# Patient Record
Sex: Male | Born: 1955 | Race: White | Hispanic: No | Marital: Married | Smoking: Former smoker
Health system: Southern US, Community
[De-identification: ages and names within clinical notes are randomized; demographics above are authoritative.]

## PROBLEM LIST (undated history)

## (undated) DIAGNOSIS — I209 Angina pectoris, unspecified: Secondary | ICD-10-CM

## (undated) DIAGNOSIS — I219 Acute myocardial infarction, unspecified: Secondary | ICD-10-CM

## (undated) DIAGNOSIS — I10 Essential (primary) hypertension: Secondary | ICD-10-CM

## (undated) HISTORY — PX: CARDIAC CATHETERIZATION: SHX172

## (undated) HISTORY — PX: CORONARY ARTERY BYPASS GRAFT: SHX141

---

## 2014-08-01 DEATH — deceased

## 2019-03-04 ENCOUNTER — Encounter
Admission: EM | Disposition: A | Discharge status: Home / Self Care | Payer: Self-pay | Source: Home / Self Care | Attending: Internal Medicine

## 2019-03-04 ENCOUNTER — Inpatient Hospital Stay: Payer: BC Managed Care – PPO

## 2019-03-04 ENCOUNTER — Inpatient Hospital Stay
Admission: EM | Admit: 2019-03-04 | Discharge: 2019-03-08 | DRG: 281 | Disposition: A | Discharge status: Home / Self Care | Payer: BC Managed Care – PPO | Attending: Internal Medicine | Admitting: Internal Medicine

## 2019-03-04 ENCOUNTER — Emergency Department: Payer: BC Managed Care – PPO

## 2019-03-04 ENCOUNTER — Other Ambulatory Visit: Payer: Self-pay

## 2019-03-04 DIAGNOSIS — Z79891 Long term (current) use of opiate analgesic: Secondary | ICD-10-CM | POA: Diagnosis not present

## 2019-03-04 DIAGNOSIS — R739 Hyperglycemia, unspecified: Secondary | ICD-10-CM | POA: Diagnosis present

## 2019-03-04 DIAGNOSIS — E785 Hyperlipidemia, unspecified: Secondary | ICD-10-CM | POA: Diagnosis present

## 2019-03-04 DIAGNOSIS — I2119 ST elevation (STEMI) myocardial infarction involving other coronary artery of inferior wall: Secondary | ICD-10-CM | POA: Diagnosis present

## 2019-03-04 DIAGNOSIS — I25110 Atherosclerotic heart disease of native coronary artery with unstable angina pectoris: Secondary | ICD-10-CM | POA: Diagnosis present

## 2019-03-04 DIAGNOSIS — I959 Hypotension, unspecified: Secondary | ICD-10-CM | POA: Diagnosis present

## 2019-03-04 DIAGNOSIS — D6959 Other secondary thrombocytopenia: Secondary | ICD-10-CM | POA: Diagnosis not present

## 2019-03-04 DIAGNOSIS — Z20828 Contact with and (suspected) exposure to other viral communicable diseases: Secondary | ICD-10-CM | POA: Diagnosis present

## 2019-03-04 DIAGNOSIS — I25810 Atherosclerosis of coronary artery bypass graft(s) without angina pectoris: Secondary | ICD-10-CM | POA: Diagnosis present

## 2019-03-04 DIAGNOSIS — I213 ST elevation (STEMI) myocardial infarction of unspecified site: Secondary | ICD-10-CM | POA: Diagnosis present

## 2019-03-04 DIAGNOSIS — I309 Acute pericarditis, unspecified: Secondary | ICD-10-CM | POA: Diagnosis not present

## 2019-03-04 DIAGNOSIS — I10 Essential (primary) hypertension: Secondary | ICD-10-CM | POA: Diagnosis present

## 2019-03-04 DIAGNOSIS — Z955 Presence of coronary angioplasty implant and graft: Secondary | ICD-10-CM | POA: Diagnosis not present

## 2019-03-04 HISTORY — DX: Essential (primary) hypertension: I10

## 2019-03-04 HISTORY — PX: CORONARY/GRAFT ACUTE MI REVASCULARIZATION: CATH118305

## 2019-03-04 HISTORY — PX: LEFT HEART CATH AND CORONARY ANGIOGRAPHY: CATH118249

## 2019-03-04 HISTORY — DX: Angina pectoris, unspecified: I20.9

## 2019-03-04 HISTORY — DX: Acute myocardial infarction, unspecified: I21.9

## 2019-03-04 LAB — COMPREHENSIVE METABOLIC PANEL
ALT: 19 U/L (ref 0–44)
AST: 18 U/L (ref 15–41)
Albumin: 4 g/dL (ref 3.5–5.0)
Alkaline Phosphatase: 100 U/L (ref 38–126)
Anion gap: 9 (ref 5–15)
BUN: 15 mg/dL (ref 8–23)
CO2: 26 mmol/L (ref 22–32)
Calcium: 9.8 mg/dL (ref 8.9–10.3)
Chloride: 99 mmol/L (ref 98–111)
Creatinine, Ser: 0.96 mg/dL (ref 0.61–1.24)
GFR calc Af Amer: >60 mL/min (ref >60)
GFR calc non Af Amer: >60 mL/min (ref >60)
Glucose, Bld: 138 mg/dL — ABNORMAL HIGH (ref 70–99)
Potassium: 3.9 mmol/L (ref 3.5–5.1)
Sodium: 134 mmol/L — ABNORMAL LOW (ref 135–145)
Total Bilirubin: 0.5 mg/dL (ref 0.3–1.2)
Total Protein: 7.6 g/dL (ref 6.5–8.1)

## 2019-03-04 LAB — CBC WITH DIFFERENTIAL/PLATELET
Abs Immature Granulocytes: 0.09 10*3/uL — ABNORMAL HIGH (ref 0.00–0.07)
Basophils Absolute: 0.1 10*3/uL (ref 0.0–0.1)
Basophils Relative: 1 %
Eosinophils Absolute: 0.1 10*3/uL (ref 0.0–0.5)
Eosinophils Relative: 1 %
HCT: 50.5 % (ref 39.0–52.0)
Hemoglobin: 17 g/dL (ref 13.0–17.0)
Immature Granulocytes: 1 %
Lymphocytes Relative: 18 %
Lymphs Abs: 2.9 10*3/uL (ref 0.7–4.0)
MCH: 29 pg (ref 26.0–34.0)
MCHC: 33.7 g/dL (ref 30.0–36.0)
MCV: 86.2 fL (ref 80.0–100.0)
Monocytes Absolute: 0.9 10*3/uL (ref 0.1–1.0)
Monocytes Relative: 6 %
Neutro Abs: 11.8 10*3/uL — ABNORMAL HIGH (ref 1.7–7.7)
Neutrophils Relative %: 73 %
Platelets: 239 10*3/uL (ref 150–400)
RBC: 5.86 MIL/uL — ABNORMAL HIGH (ref 4.22–5.81)
RDW: 13.9 % (ref 11.5–15.5)
WBC: 15.9 10*3/uL — ABNORMAL HIGH (ref 4.0–10.5)
nRBC: 0 % (ref 0.0–0.2)

## 2019-03-04 LAB — LIPID PANEL
Cholesterol: 158 mg/dL (ref 0–200)
HDL: 30 mg/dL — ABNORMAL LOW (ref >40)
LDL Cholesterol: 108 mg/dL — ABNORMAL HIGH (ref 0–99)
Total CHOL/HDL Ratio: 5.3 RATIO
Triglycerides: 102 mg/dL (ref <150)
VLDL: 20 mg/dL (ref 0–40)

## 2019-03-04 LAB — PROTIME-INR
INR: 1 (ref 0.8–1.2)
Prothrombin Time: 13.5 seconds (ref 11.4–15.2)

## 2019-03-04 LAB — TROPONIN I (HIGH SENSITIVITY): Troponin I (High Sensitivity): 42 ng/L — ABNORMAL HIGH (ref <18)

## 2019-03-04 LAB — APTT: aPTT: 37 seconds — ABNORMAL HIGH (ref 24–36)

## 2019-03-04 LAB — POC SARS CORONAVIRUS 2 AG: SARS Coronavirus 2 Ag: NEGATIVE

## 2019-03-04 SURGERY — CORONARY/GRAFT ACUTE MI REVASCULARIZATION
Anesthesia: Moderate Sedation

## 2019-03-04 MED ORDER — CARVEDILOL 3.125 MG PO TABS
3.1250 mg | ORAL_TABLET | Freq: Two times a day (BID) | ORAL | Status: DC
  Administered 2019-03-05 – 2019-03-07 (×6): 3.125 mg via ORAL
  Filled 2019-03-04 (×7): qty 1

## 2019-03-04 MED ORDER — TICAGRELOR 90 MG PO TABS
90.0000 mg | ORAL_TABLET | Freq: Two times a day (BID) | ORAL | Status: DC
  Administered 2019-03-05 – 2019-03-07 (×6): 90 mg via ORAL
  Filled 2019-03-04 (×6): qty 1

## 2019-03-04 MED ORDER — HYDRALAZINE HCL 20 MG/ML IJ SOLN: 10.0000 mg | INTRAMUSCULAR | Status: AC | PRN

## 2019-03-04 MED ORDER — NITROGLYCERIN IN D5W 200-5 MCG/ML-% IV SOLN
INTRAVENOUS | Status: AC
  Filled 2019-03-04: qty 250

## 2019-03-04 MED ORDER — MIDAZOLAM HCL 2 MG/2ML IJ SOLN
INTRAMUSCULAR | Status: AC
  Filled 2019-03-04: qty 2

## 2019-03-04 MED ORDER — SODIUM CHLORIDE 0.9 % WEIGHT BASED INFUSION
1.0000 mL/kg/h | INTRAVENOUS | Status: AC
  Administered 2019-03-05: 1 mL/kg/h via INTRAVENOUS

## 2019-03-04 MED ORDER — HEPARIN (PORCINE) IN NACL 2000-0.9 UNIT/L-% IV SOLN
INTRAVENOUS | Status: DC | PRN
  Administered 2019-03-04: 1000 mL

## 2019-03-04 MED ORDER — HEPARIN SODIUM (PORCINE) 5000 UNIT/ML IJ SOLN: 4000.0000 [IU] | Freq: Once | INTRAMUSCULAR | Status: DC

## 2019-03-04 MED ORDER — TICAGRELOR 90 MG PO TABS: 90.0000 mg | ORAL_TABLET | Freq: Two times a day (BID) | ORAL | Status: DC

## 2019-03-04 MED ORDER — ATORVASTATIN CALCIUM 20 MG PO TABS
40.0000 mg | ORAL_TABLET | Freq: Every day | ORAL | Status: DC
  Administered 2019-03-05 – 2019-03-07 (×3): 40 mg via ORAL
  Filled 2019-03-04 (×3): qty 2

## 2019-03-04 MED ORDER — TIROFIBAN (AGGRASTAT) BOLUS VIA INFUSION
INTRAVENOUS | Status: DC | PRN
  Administered 2019-03-04: 2212.5 ug via INTRAVENOUS

## 2019-03-04 MED ORDER — IOHEXOL 300 MG/ML  SOLN
INTRAMUSCULAR | Status: DC | PRN
  Administered 2019-03-04: 70 mL via INTRA_ARTERIAL

## 2019-03-04 MED ORDER — ONDANSETRON HCL 4 MG/2ML IJ SOLN
4.0000 mg | Freq: Four times a day (QID) | INTRAMUSCULAR | Status: DC | PRN
  Administered 2019-03-05 – 2019-03-06 (×4): 4 mg via INTRAVENOUS
  Filled 2019-03-04 (×4): qty 2

## 2019-03-04 MED ORDER — ASPIRIN 81 MG PO CHEW: 324.0000 mg | CHEWABLE_TABLET | Freq: Once | ORAL | Status: DC

## 2019-03-04 MED ORDER — SODIUM CHLORIDE 0.9 % IV SOLN
Freq: Once | INTRAVENOUS | Status: AC
  Administered 2019-03-04: via INTRAVENOUS

## 2019-03-04 MED ORDER — FENTANYL CITRATE (PF) 100 MCG/2ML IJ SOLN
INTRAMUSCULAR | Status: DC | PRN
  Administered 2019-03-04 (×2): 25 ug via INTRAVENOUS

## 2019-03-04 MED ORDER — NITROGLYCERIN IN D5W 200-5 MCG/ML-% IV SOLN
INTRAVENOUS | Status: AC | PRN
  Administered 2019-03-04: 20 ug/min via INTRAVENOUS

## 2019-03-04 MED ORDER — TIROFIBAN HCL IN NACL 5-0.9 MG/100ML-% IV SOLN
INTRAVENOUS | Status: AC | PRN
  Administered 2019-03-04: 0.15 ug/kg/min via INTRAVENOUS
  Administered 2019-03-05: 0.15 ug/kg/min

## 2019-03-04 MED ORDER — ACETAMINOPHEN 325 MG PO TABS: 650.0000 mg | ORAL_TABLET | ORAL | Status: DC | PRN

## 2019-03-04 MED ORDER — SODIUM CHLORIDE 0.9 % IV SOLN
INTRAVENOUS | Status: DC
  Administered 2019-03-04: 10 mL/h via INTRAVENOUS

## 2019-03-04 MED ORDER — TIROFIBAN HCL IV 12.5 MG/250 ML
INTRAVENOUS | Status: AC
  Filled 2019-03-04: qty 250

## 2019-03-04 MED ORDER — SODIUM CHLORIDE 0.9% FLUSH
3.0000 mL | Freq: Two times a day (BID) | INTRAVENOUS | Status: DC
  Administered 2019-03-05: 3 mL via INTRAVENOUS

## 2019-03-04 MED ORDER — CHLORHEXIDINE GLUCONATE CLOTH 2 % EX PADS
6.0000 | MEDICATED_PAD | Freq: Every day | CUTANEOUS | Status: DC
  Administered 2019-03-06 – 2019-03-08 (×3): 6 via TOPICAL

## 2019-03-04 MED ORDER — TICAGRELOR 90 MG PO TABS
ORAL_TABLET | ORAL | Status: AC
  Filled 2019-03-04: qty 2

## 2019-03-04 MED ORDER — SODIUM CHLORIDE 0.9 % IV SOLN: 250.0000 mL | INTRAVENOUS | Status: DC | PRN

## 2019-03-04 MED ORDER — ASPIRIN 81 MG PO CHEW
81.0000 mg | CHEWABLE_TABLET | Freq: Every day | ORAL | Status: DC
  Administered 2019-03-05 – 2019-03-07 (×2): 81 mg via ORAL
  Filled 2019-03-04 (×2): qty 1

## 2019-03-04 MED ORDER — ASPIRIN 81 MG PO CHEW
CHEWABLE_TABLET | ORAL | Status: AC
  Filled 2019-03-04: qty 4

## 2019-03-04 MED ORDER — MIDAZOLAM HCL 2 MG/2ML IJ SOLN
INTRAMUSCULAR | Status: DC | PRN
  Administered 2019-03-04 (×2): 1 mg via INTRAVENOUS

## 2019-03-04 MED ORDER — SODIUM CHLORIDE 0.9% FLUSH
3.0000 mL | Freq: Two times a day (BID) | INTRAVENOUS | Status: DC
  Administered 2019-03-04 – 2019-03-05 (×3): 3 mL via INTRAVENOUS

## 2019-03-04 MED ORDER — HEPARIN (PORCINE) IN NACL 1000-0.9 UT/500ML-% IV SOLN
INTRAVENOUS | Status: AC
  Filled 2019-03-04: qty 1000

## 2019-03-04 MED ORDER — LABETALOL HCL 5 MG/ML IV SOLN: 10.0000 mg | INTRAVENOUS | Status: AC | PRN

## 2019-03-04 MED ORDER — LOSARTAN POTASSIUM 50 MG PO TABS: 50.0000 mg | ORAL_TABLET | Freq: Every day | ORAL | Status: DC

## 2019-03-04 MED ORDER — ASPIRIN 81 MG PO CHEW
CHEWABLE_TABLET | ORAL | Status: DC | PRN
  Administered 2019-03-04: 324 mg via ORAL

## 2019-03-04 MED ORDER — TICAGRELOR 90 MG PO TABS
ORAL_TABLET | ORAL | Status: DC | PRN
  Administered 2019-03-04: 180 mg via ORAL

## 2019-03-04 MED ORDER — FENTANYL CITRATE (PF) 100 MCG/2ML IJ SOLN
INTRAMUSCULAR | Status: AC
  Filled 2019-03-04: qty 2

## 2019-03-04 MED ORDER — SODIUM CHLORIDE 0.9% FLUSH
3.0000 mL | INTRAVENOUS | Status: DC | PRN
  Administered 2019-03-05: 3 mL via INTRAVENOUS
  Filled 2019-03-04: qty 3

## 2019-03-04 SURGICAL SUPPLY — 12 items
CATH INFINITI 5FR JL4 (CATHETERS) ×3 IMPLANT
CATH INFINITI JR4 5F (CATHETERS) ×3 IMPLANT
DEVICE CLOSURE MYNXGRIP 6/7F (Vascular Products) ×3 IMPLANT
DEVICE INFLAT 30 PLUS (MISCELLANEOUS) IMPLANT
GLIDESHEATH SLEND SS 6F .021 (SHEATH) IMPLANT
KIT MANI 3VAL PERCEP (MISCELLANEOUS) ×3 IMPLANT
NEEDLE PERC 18GX7CM (NEEDLE) ×3 IMPLANT
PACK CARDIAC CATH (CUSTOM PROCEDURE TRAY) ×3 IMPLANT
PROTECTION STATION PRESSURIZED (MISCELLANEOUS) ×3
SHEATH AVANTI 6FR X 11CM (SHEATH) ×3 IMPLANT
STATION PROTECTION PRESSURIZED (MISCELLANEOUS) ×1 IMPLANT
WIRE GUIDERIGHT .035X150 (WIRE) ×3 IMPLANT

## 2019-03-04 NOTE — ED Triage Notes (Signed)
Pt arrived via [facility] from [place] stop with a STEMI. Pt is a truck driver c/o chest pain. 18G left AC. Hx of CABG. Pt is on asa, took nitro before calling ems. Pt a&o x4.

## 2019-03-04 NOTE — ED Provider Notes (Signed)
[facility] Emergency Department Provider Note  ____________________________________________   First MD Initiated Contact with Patient 03/04/19 2116     (approximate)  I have reviewed the triage vital signs and the nursing notes.  History  Chief Complaint Code STEMI    HPI Jose Savage is a 63 y.o. male with hx of CABG who presents for chest pain. He developed central chest pressure this afternoon around 4 PM. Describes it as if someone is standing on his chest + bad indigestion. Central in location, moderate/severe in severity. Does not radiate. No alleviating/aggravating factors.  Associated with nausea. He took baby ASA x 4 and nitro. Called EMS, found to have inferior STEMI on EKG. Activated as CODE STEMI by EMS.   Reports CABG about years ago in [place]. Works as a Administrator.    Past Medical Hx History reviewed. No pertinent past medical history.  Problem List There are no active problems to display for this patient.   Past Surgical Hx CABG  Medications Prior to Admission medications   Not on File    Allergies Patient has no allergy information on record.  Family Hx History reviewed. No pertinent family history.  Social Hx Social History   Tobacco Use  . Smoking status: Not on file  Substance Use Topics  . Alcohol use: Not on file  . Drug use: Not on file     Review of Systems  Constitutional: Negative for fever, chills. Eyes: Negative for visual changes. ENT: Negative for sore throat. Cardiovascular: + for chest pain. Respiratory: Negative for shortness of breath. Gastrointestinal: + nausea Genitourinary: Negative for dysuria. Musculoskeletal: Negative for leg swelling. Skin: Negative for rash. Neurological: Negative for for headaches.   Physical Exam  Vital Signs: ED Triage Vitals  Enc Vitals Group     BP 03/04/19 2112 140/87     Pulse Rate 03/04/19 2112 68     Resp 03/04/19 2112 16     Temp 03/04/19  2112 98 F (36.7 C)     Temp Source 03/04/19 2112 Oral     SpO2 03/04/19 2112 98 %     Weight 03/04/19 2113 195 lb (88.5 kg)     Height 03/04/19 2113 5\' 11"  (1.803 m)     Head Circumference --      Peak Flow --      Pain Score 03/04/19 2113 5     Pain Loc --      Pain Edu? --      Excl. in [place]? --     Constitutional: Alert and oriented.  Head: Normocephalic. Atraumatic. Eyes: Conjunctivae clear. Sclera anicteric. Nose: No congestion. No rhinorrhea. Mouth/Throat: Wearing mask.  Neck: No stridor.   Cardiovascular: Normal rate, regular rhythm. Extremities well perfused. Respiratory: Normal respiratory effort.   Musculoskeletal: No lower extremity edema. No deformities. Neurologic:  Normal speech and language. No gross focal neurologic deficits are appreciated.  Skin: Skin is warm, dry and intact. No rash noted. Psychiatric: Mood and affect are appropriate for situation.  EKG  Personally reviewed.   Rate: 59 Rhythm: sinus Axis: normal Intervals: WNL ST elevation in II, III, aVF with reciprocal changes in I and aVL STEMI    Radiology  N/A   Procedures  Procedure(s) performed (including critical care):  .Critical Care Performed by: Lilia Pro., MD Authorized by: Lilia Pro., MD   Critical care provider statement:    Critical care time (minutes):  15   Critical care was time spent personally by  me on the following activities:  Discussions with consultants, evaluation of patient's response to treatment, examination of patient, ordering and performing treatments and interventions, ordering and review of laboratory studies, ordering and review of radiographic studies, pulse oximetry, re-evaluation of patient's condition, obtaining history from patient or surrogate and review of old charts     Initial Impression / Assessment and Plan / ED Course  63 y.o. male with history of CABG who presents to the ED for chest pressure, as above. STEMI criteria on EMS EKG.  Took full dose ASA and nitro prior to arrival.  EKG on arrival to ED again demonstrates STEMI, as noted above. HDS. CODE STEMI activated in the field by EMS. Discussed with cardiology. Patient to cath lab.     Final Clinical Impression(s) / ED Diagnosis  Final diagnoses:  ST elevation myocardial infarction (STEMI), unspecified artery (HCC)       Note:  This document was prepared using Dragon voice recognition software and may include unintentional dictation errors.   Miguel Aschoff., MD 03/05/19 [phone]

## 2019-03-04 NOTE — Consult Note (Signed)
[facility] Cardiology  CARDIOLOGY CONSULT NOTE  Patient ID: Jose Savage MRN: [id number] DOB/AGE: [id number] y.o.  Admit date: 03/04/2019 Referring Physician [facility] Primary Physician  Primary Cardiologist  Reason for Consultation inferior STEMI  HPI: 63 year old gentleman referred for evaluation of inferior ST elevation myocardial infarction.  Patient has a history of coronary artery disease, status post prior coronary stents.  He underwent CABG in 2014 in [place].  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  ECG revealed ST elevations in inferior leads consistent with inferior ST elevation myocardial infarction.  Review of systems complete and found to be negative unless listed above      No medications prior to admission.   Social History   Socioeconomic History  . Marital status: Unknown    Spouse name: Not on file  . Number of children: Not on file  . Years of education: Not on file  . Highest education level: Not on file  Occupational History  . Not on file  Social Needs  . Financial resource strain: Not on file  . Food insecurity    Worry: Not on file    Inability: Not on file  . Transportation needs    Medical: Not on file    Non-medical: Not on file  Tobacco Use  . Smoking status: Not on file  Substance and Sexual Activity  . Alcohol use: Not on file  . Drug use: Not on file  . Sexual activity: Not on file  Lifestyle  . Physical activity    Days per week: Not on file    Minutes per session: Not on file  . Stress: Not on file  Relationships  . Social Musician on phone: Not on file    Gets together: Not on file    Attends religious service: Not on file    Active member of club or organization: Not on file    Attends meetings of clubs or organizations: Not on file    Relationship status: Not on file  . Intimate  partner violence    Fear of current or ex partner: Not on file    Emotionally abused: Not on file    Physically abused: Not on file    Forced sexual activity: Not on file  Other Topics Concern  . Not on file  Social History Narrative  . Not on file    History reviewed. No pertinent family history.    Review of systems complete and found to be negative unless listed above      PHYSICAL EXAM  General: Well developed, well nourished, in no acute distress HEENT:  Normocephalic and atramatic Neck:  No JVD.  Lungs: Clear bilaterally to auscultation and percussion. Heart: HRRR . Normal S1 and S2 without gallops or murmurs.  Abdomen: Bowel sounds are positive, abdomen soft and non-tender  Msk:  Back normal, normal gait. Normal strength and tone for age. Extremities: No clubbing, cyanosis or edema.   Neuro: Alert and oriented X 3. Psych:  Good affect, responds appropriately  Labs:   Lab Results  Component Value Date   WBC 15.9 (H) 03/04/2019   HGB 17.0 03/04/2019   HCT 50.5 03/04/2019   MCV 86.2 03/04/2019   PLT 239 03/04/2019    Recent Labs  Lab 03/04/19 2112  NA 134*  K 3.9  CL 99  CO2 26  BUN 15  CREATININE 0.96  CALCIUM 9.8  PROT 7.6  BILITOT 0.5  ALKPHOS 100  ALT 19  AST 18  GLUCOSE 138*   No results found for: CKTOTAL, CKMB, CKMBINDEX, TROPONINI  Lab Results  Component Value Date   CHOL 158 03/04/2019   Lab Results  Component Value Date   HDL 30 (L) 03/04/2019   Lab Results  Component Value Date   LDLCALC 108 (H) 03/04/2019   Lab Results  Component Value Date   TRIG 102 03/04/2019   Lab Results  Component Value Date   CHOLHDL 5.3 03/04/2019   No results found for: LDLDIRECT    Radiology: No results found.  EKG: Sinus rhythm with ST elevation in leads II, III and aVF  ASSESSMENT AND PLAN:   1.  Inferior ST elevation myocardial infarction 2.  Essential hypertension 3.  Hyperlipidemia  Recommendations  1.  Emergent cardiac  catheterization and probable primary PCI  Signed: Isaias Cowman MD,PhD, [facility] 03/04/2019, 10:56 PM

## 2019-03-04 NOTE — ED Notes (Signed)
Cardiologist at bedside.  

## 2019-03-04 NOTE — Progress Notes (Signed)
   03/04/19 2128  Clinical Encounter Type  Visited With Patient not available;Health care provider  Visit Type Initial;Code  Referral From Nurse   Chaplain received a code STEMI page for the patient. Upon arrival, the medical team was assessing the patient and providing care. Chaplain maintained pastoral presence outside of the patient's room. No family present at this time. Patient transported to the Cath Lab for procedure. Please call or page if necessary.

## 2019-03-05 ENCOUNTER — Encounter: Payer: Self-pay | Admitting: Emergency Medicine

## 2019-03-05 ENCOUNTER — Other Ambulatory Visit: Payer: Self-pay

## 2019-03-05 ENCOUNTER — Inpatient Hospital Stay
Admit: 2019-03-05 | Discharge: 2019-03-05 | Disposition: A | Discharge status: Home / Self Care | Payer: BC Managed Care – PPO | Attending: Cardiology | Admitting: Cardiology

## 2019-03-05 DIAGNOSIS — E785 Hyperlipidemia, unspecified: Secondary | ICD-10-CM

## 2019-03-05 DIAGNOSIS — I959 Hypotension, unspecified: Secondary | ICD-10-CM

## 2019-03-05 DIAGNOSIS — I2119 ST elevation (STEMI) myocardial infarction involving other coronary artery of inferior wall: Principal | ICD-10-CM

## 2019-03-05 LAB — TROPONIN I (HIGH SENSITIVITY)
Troponin I (High Sensitivity): 403 ng/L (ref <18)
Troponin I (High Sensitivity): 23917 ng/L (ref <18)
Troponin I (High Sensitivity): 22535 ng/L (ref <18)

## 2019-03-05 LAB — MRSA PCR SCREENING: MRSA by PCR: NEGATIVE

## 2019-03-05 LAB — SARS CORONAVIRUS 2 BY RT PCR (HOSPITAL ORDER, PERFORMED IN ~~LOC~~ HOSPITAL LAB): SARS Coronavirus 2: NEGATIVE

## 2019-03-05 LAB — GLUCOSE, CAPILLARY: Glucose-Capillary: 123 mg/dL — ABNORMAL HIGH (ref 70–99)

## 2019-03-05 MED ORDER — SODIUM CHLORIDE 0.9 % IV SOLN: 250.0000 mL | INTRAVENOUS | Status: DC | PRN

## 2019-03-05 MED ORDER — SODIUM CHLORIDE 0.9 % WEIGHT BASED INFUSION: 1.0000 mL/kg/h | INTRAVENOUS | Status: DC

## 2019-03-05 MED ORDER — MORPHINE BOLUS VIA INFUSION: 1.0000 mg | INTRAVENOUS | Status: DC | PRN

## 2019-03-05 MED ORDER — MORPHINE SULFATE (PF) 2 MG/ML IV SOLN
2.0000 mg | INTRAVENOUS | Status: DC | PRN
  Administered 2019-03-05 – 2019-03-06 (×4): 2 mg via INTRAVENOUS
  Filled 2019-03-05 (×4): qty 1

## 2019-03-05 MED ORDER — MORPHINE SULFATE (PF) 2 MG/ML IV SOLN
INTRAVENOUS | Status: AC
  Administered 2019-03-05: 2 mg via INTRAVENOUS
  Filled 2019-03-05: qty 1

## 2019-03-05 MED ORDER — SODIUM CHLORIDE 0.9% FLUSH: 3.0000 mL | INTRAVENOUS | Status: DC | PRN

## 2019-03-05 MED ORDER — SODIUM CHLORIDE 0.9% FLUSH
3.0000 mL | INTRAVENOUS | Status: DC | PRN
  Administered 2019-03-07: 3 mL via INTRAVENOUS
  Filled 2019-03-05: qty 3

## 2019-03-05 MED ORDER — MORPHINE BOLUS VIA INFUSION: 2.0000 mg | INTRAVENOUS | Status: DC | PRN

## 2019-03-05 MED ORDER — SODIUM CHLORIDE 0.9% FLUSH
3.0000 mL | Freq: Two times a day (BID) | INTRAVENOUS | Status: DC
  Administered 2019-03-05 – 2019-03-07 (×2): 3 mL via INTRAVENOUS

## 2019-03-05 MED ORDER — SODIUM CHLORIDE 0.9 % IV SOLN
Freq: Once | INTRAVENOUS | Status: AC
  Administered 2019-03-05: 02:00:00 via INTRAVENOUS

## 2019-03-05 MED ORDER — ASPIRIN 81 MG PO CHEW
81.0000 mg | CHEWABLE_TABLET | ORAL | Status: AC
  Administered 2019-03-06: 81 mg via ORAL
  Filled 2019-03-05: qty 1

## 2019-03-05 MED ORDER — ASPIRIN 81 MG PO CHEW: 81.0000 mg | CHEWABLE_TABLET | ORAL | Status: AC

## 2019-03-05 MED ORDER — SODIUM CHLORIDE 0.9 % WEIGHT BASED INFUSION: 3.0000 mL/kg/h | INTRAVENOUS | Status: AC

## 2019-03-05 MED ORDER — TIROFIBAN HCL IV 12.5 MG/250 ML
0.1500 ug/kg/min | INTRAVENOUS | Status: DC
  Administered 2019-03-05 (×2): 0.15 ug/kg/min via INTRAVENOUS
  Filled 2019-03-05 (×4): qty 250

## 2019-03-05 MED ORDER — MORPHINE SULFATE (PF) 2 MG/ML IV SOLN: 1.0000 mg | INTRAVENOUS | Status: DC | PRN

## 2019-03-05 MED ORDER — SODIUM CHLORIDE 0.9% FLUSH
3.0000 mL | Freq: Two times a day (BID) | INTRAVENOUS | Status: DC
  Administered 2019-03-05 – 2019-03-07 (×4): 3 mL via INTRAVENOUS

## 2019-03-05 MED ORDER — SODIUM CHLORIDE 0.9 % WEIGHT BASED INFUSION
1.0000 mL/kg/h | INTRAVENOUS | Status: DC
  Administered 2019-03-05 (×2): 1 mL/kg/h via INTRAVENOUS

## 2019-03-05 MED ORDER — HEART ATTACK BOUNCING BOOK: Freq: Once | Status: DC

## 2019-03-05 NOTE — Plan of Care (Signed)
Pt with episode of vomiting x2, Zofran given x2times. Chest Pressure/CP at 2/10, morphine 2mg  given x2 time with reliefs each time. Nitro paused D/T low BP/LHR MD aware. 2lit NS bolus given as ordered. BP improved. Pt dangled at bedside and stood up and ambulated in room without any problem. Pt voiding without any problem. Right groin sheet at level zero. Bilat dorsal/pedal pulses 2+ and strong. VSS now. Pt with occu PVC  when moving around. Pt continued on Aggrastat gtt started at 2230 per cath lab report at 0.70mcq will run for 18hrs per MD and will be stopped at 16:30pm. NS at 88.5/hr as ordered.

## 2019-03-05 NOTE — H&P (Signed)
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 male with medical history significant of CAD s/p stent (many years ago) and CABG x 4 in 2014, hypertension and hyperlipidemia who presented with symptoms of mid-sternal pressure chest pain that radiated to his right shoulder. He at first thought it was indigestion and tried tums without relieve. Later took aspirin x4  and nitro. Pain lasted 4 hours until he drove through [place] on his truck and called EMS.  EKG showed ST elevations in inferior leads consistent with inferior ST elevation MI and CODE STEMI was activated.  Patient was subsequently taken for emergent cardiac catheterization.  Found to have 'severe two-vessel coronary artery disease with chronically occluded mid to distal RCA, chronic occluded mid left circumflex.  Atretic LIMA to LAD with patent native LAD.  Occluded SVG to distal RCA with diffuse heavy thrombus burden.' Hospitalist asked by cardiologist Paraschos to admit for continuous medical management.  Patient was placed on nitro and Aggrastat bolus and gtt by cardiology. His chest pain is now a 1/10. BP was noted to be 96/60 with MAP of 71 with continuous IV fluid infusing . Bradycardic down to 50s on room air.   CBC showed leukocytosis of 15.9 but no anemia.  Sodium 134, potassium 3.9, glucose 138, normal creatinine of 0.96.  Troponin of 42.  Negative chest x-ray.  Former tobacco use but quit in 2013. Denies alcohol and illicit drug use.  Review of Systems:  Constitutional: No Weight Change, No Fever ENT/Mouth: No sore throat, No Rhinorrhea Eyes: No Eye Pain, No Vision Changes Cardiovascular: + Chest Pain, no SOB Respiratory: No Cough, no Dyspnea   Gastrointestinal: No Nausea, No Vomiting, No Diarrhea, No Constipation, No Pain Genitourinary: no Urinary Incontinence, No Urgency, No Flank Pain Musculoskeletal: No Arthralgias, No Myalgias Skin: No Skin Lesions, No Pruritus, Neuro: no Weakness, No Numbness,  No Loss of Consciousness, No Syncope Psych: No Anxiety/Panic, No Depression, no decrease appetite Heme/Lymph: No Bruising, No Bleeding   Prior to Admission medications   Not on File    Physical Exam: Vitals:   03/04/19 2112 03/04/19 2113 03/04/19 2345 03/05/19 0000  BP: 140/87  93/62 (!) 83/62  Pulse: 68     Resp: 16  15 18   Temp: 98 F (36.7 C)   97.8 F (36.6 C)  TempSrc: Oral   Oral  SpO2: 98%   97%  Weight:  88.5 kg  82.5 kg  Height:  5\' 11"  (1.803 m)  5\' 11"  (1.803 m)    Constitutional: NAD, calm, comfortable, nontoxic appearing male laying flat in bed Vitals:   03/04/19 2112 03/04/19 2113 03/04/19 2345 03/05/19 0000  BP: 140/87  93/62 (!) 83/62  Pulse: 68     Resp: 16  15 18   Temp: 98 F (36.7 C)   97.8 F (36.6 C)  TempSrc: Oral   Oral  SpO2: 98%   97%  Weight:  88.5 kg  82.5 kg  Height:  5\' 11"  (1.803 m)  5\' 11"  (1.803 m)   Eyes: PERRL, lids and conjunctivae normal ENMT: Mucous membranes are moist. Posterior pharynx clear of any exudate or lesions. Neck: normal, supple, no masses Respiratory: clear to auscultation bilaterally, no wheezing, no  crackles. Normal respiratory effort. No accessory muscle use.  Cardiovascular: Bradycardia down to 50s with normal sinus rhythm on telemetry, no murmurs / rubs / gallops. No extremity edema. 2+ pedal pulses.  Abdomen: no tenderness, no masses palpated.  Bowel sounds positive.  Clean and dry bandage to right groin s/p cardiac catheterization. Musculoskeletal: no clubbing / cyanosis. No joint deformity upper and lower extremities. Good ROM, no contractures. Normal muscle tone.  Skin: no rashes, lesions, ulcers. No induration Neurologic: CN 2-12 grossly intact.  Sensation intact. Strength 5/5 in all 4.  Psychiatric: Normal judgment and insight. Alert and oriented x 3. Normal mood.    Labs on Admission: I have personally reviewed following labs and imaging studies  CBC: Recent Labs  Lab 03/04/19 2112  WBC 15.9*  NEUTROABS 11.8*  HGB 17.0  HCT 50.5  MCV 86.2  PLT 239   Basic Metabolic Panel: Recent Labs  Lab 03/04/19 2112  NA 134*  K 3.9  CL 99  CO2 26  GLUCOSE 138*  BUN 15  CREATININE 0.96  CALCIUM 9.8   GFR: Estimated Creatinine Clearance: 82.8 mL/min (by C-G formula based on SCr of 0.96 mg/dL). Liver Function Tests: Recent Labs  Lab 03/04/19 2112  AST 18  ALT 19  ALKPHOS 100  BILITOT 0.5  PROT 7.6  ALBUMIN 4.0   No results for input(s): LIPASE, AMYLASE in the last 168 hours. No results for input(s): AMMONIA in the last 168 hours. Coagulation Profile: Recent Labs  Lab 03/04/19 2112  INR 1.0   Cardiac Enzymes: No results for input(s): CKTOTAL, CKMB, CKMBINDEX, TROPONINI in the last 168 hours. BNP (last 3 results) No results for input(s): PROBNP in the last 8760 hours. HbA1C: No results for input(s): HGBA1C in the last 72 hours. CBG: No results for input(s): GLUCAP in the last 168 hours. Lipid Profile: Recent Labs    03/04/19 2112  CHOL 158  HDL 30*  LDLCALC 108*  TRIG 102  CHOLHDL 5.3   Thyroid Function Tests: No results for input(s): TSH, T4TOTAL, FREET4, T3FREE, THYROIDAB in the last 72 hours. Anemia Panel: No results for input(s): VITAMINB12, FOLATE, FERRITIN, TIBC, IRON, RETICCTPCT in the last 72 hours. Urine analysis: No results found for: COLORURINE, APPEARANCEUR, LABSPEC, PHURINE, GLUCOSEU, HGBUR, BILIRUBINUR, KETONESUR, PROTEINUR, UROBILINOGEN, NITRITE, LEUKOCYTESUR  Radiological Exams on Admission: Dg Chest Port 1 View  Result Date: 03/04/2019 CLINICAL DATA:  63 year old male with STEMI. EXAM: PORTABLE CHEST 1 VIEW COMPARISON:  None. FINDINGS: There is no focal consolidation, pleural  effusion, or pneumothorax. The cardiac silhouette is within normal limits. Median sternotomy wires and CABG vascular clips. No acute osseous pathology. IMPRESSION: No active disease. Electronically Signed   By: Elgie Collard M.D.   On: 03/04/2019 23:31    EKG: Independently reviewed.   Assessment/Plan  Inferior wall STEMI -S/p cardiac cath 12/2 with findings of severe two-vessel CAD, treated LIMA to LAD, occluded SVG to distal RCA with diffuse heavy thrombus burden -Continuous telemetry monitoring -Cardiology recommends initial conservative management -Continue dual antiplatelet therapy with aspirin and Brilinta -Continue Aggrastat bolus and infusion -Possible repeat relook cardiac catheterization on 12/3-will keep NPO  Hypotension - continue IV fluid infusion - Maintain MAP >65  - Hold Losartan for now  Hyperlipidemia -Continue Lipitor  DVT prophylaxis: Aggrastat infusion Code Status:Full Family Communication: Plan discussed with patient at bedside  disposition Plan: Home with at least 2 midnight stays  Consults called: Cardiology Admission status: inpatient      Kathrine Rieves T Jazelle Achey DO [facility]  If 7PM-7AM, please contact night-coverage [email or web address] Password [email or web address]  03/05/2019, 12:21 AM

## 2019-03-05 NOTE — Progress Notes (Signed)
PROGRESS NOTE    Jose Savage  EHU:[id number] DOB: 1955-11-13 DOA: 03/04/2019 PCP: Patient, No Pcp Per       Assessment & Plan:   Active Problems:   STEMI involving oth coronary artery of inferior wall (HCC)   STEMI (ST elevation myocardial infarction) (HCC)  #. STEMI: S/p cardiac cath 12/2 with findings of severe two-vessel CAD, treated LIMA to LAD, occluded SVG to distal RCA with diffuse heavy thrombus burden. No stents were place. Continue w/ medical management but pt may go back to cath lab today as per cardio. Cardio recs apprec. Continue on tele   #. Hypotension: continue on IVFs. Keep MAP >65  #. HLD: continue on statin  #. Leukocytosis: likely reactive. Will continue to monitor   #. Hyperglycemia: no hx of DM. Will continue to monitor     DVT prophylaxis: aggrastat infusion  Code Status: full     Consultants:  Cardio    Procedures: cardiac cath, refer to cath report for more information    Antimicrobials: n/a   Subjective: Pt c/o chest pain   Objective: Vitals:   03/05/19 0630 03/05/19 0700 03/05/19 0800 03/05/19 0811  BP: 109/75 120/76 112/68 112/68  Pulse: 76 63 64 63  Resp: 19 15 16    Temp:      TempSrc:      SpO2: 96% 95% 95%   Weight:      Height:        Intake/Output Summary (Last 24 hours) at 03/05/2019 0838 Last data filed at 03/05/2019 0500 Gross per 24 hour  Intake 2594.57 ml  Output 2200 ml  Net 394.57 ml   Filed Weights   03/04/19 2113 03/05/19 0000  Weight: 88.5 kg 82.5 kg    Examination:  General exam: Appears calm and comfortable  Respiratory system: Clear to auscultation. Respiratory effort normal. Cardiovascular system: S1 & S2 heard. No rubs or gallops  Gastrointestinal system: Abdomen is nondistended, soft and nontender. Normal bowel sounds heard. Central nervous system: Alert and oriented. Moves all 4 extremities Psychiatry: Judgement and insight appear normal. Mood & affect appropriate.     Data Reviewed: I  have personally reviewed following labs and imaging studies  CBC: Recent Labs  Lab 03/04/19 2112  WBC 15.9*  NEUTROABS 11.8*  HGB 17.0  HCT 50.5  MCV 86.2  PLT 239   Basic Metabolic Panel: Recent Labs  Lab 03/04/19 2112  NA 134*  K 3.9  CL 99  CO2 26  GLUCOSE 138*  BUN 15  CREATININE 0.96  CALCIUM 9.8   GFR: Estimated Creatinine Clearance: 82.8 mL/min (by C-G formula based on SCr of 0.96 mg/dL). Liver Function Tests: Recent Labs  Lab 03/04/19 2112  AST 18  ALT 19  ALKPHOS 100  BILITOT 0.5  PROT 7.6  ALBUMIN 4.0   No results for input(s): LIPASE, AMYLASE in the last 168 hours. No results for input(s): AMMONIA in the last 168 hours. Coagulation Profile: Recent Labs  Lab 03/04/19 2112  INR 1.0   Cardiac Enzymes: No results for input(s): CKTOTAL, CKMB, CKMBINDEX, TROPONINI in the last 168 hours. BNP (last 3 results) No results for input(s): PROBNP in the last 8760 hours. HbA1C: No results for input(s): HGBA1C in the last 72 hours. CBG: No results for input(s): GLUCAP in the last 168 hours. Lipid Profile: Recent Labs    03/04/19 2112  CHOL 158  HDL 30*  LDLCALC 108*  TRIG 102  CHOLHDL 5.3   Thyroid Function Tests: No results for input(s): TSH,  T4TOTAL, FREET4, T3FREE, THYROIDAB in the last 72 hours. Anemia Panel: No results for input(s): VITAMINB12, FOLATE, FERRITIN, TIBC, IRON, RETICCTPCT in the last 72 hours. Sepsis Labs: No results for input(s): PROCALCITON, LATICACIDVEN in the last 168 hours.  Recent Results (from the past 240 hour(s))  MRSA PCR Screening     Status: None   Collection Time: 03/04/19 10:53 PM   Specimen: Nasal Mucosa; Nasopharyngeal  Result Value Ref Range Status   MRSA by PCR NEGATIVE NEGATIVE Final    Comment:        The GeneXpert MRSA Assay (FDA approved for NASAL specimens only), is one component of a comprehensive MRSA colonization surveillance program. It is not intended to diagnose MRSA infection nor to guide  or monitor treatment for MRSA infections. Performed at [facility], [address] [place], [place] [address]   SARS Coronavirus 2 by RT PCR (hospital order, performed in [facility] hospital lab) Nasopharyngeal Nasopharyngeal Swab     Status: None   Collection Time: 03/04/19 10:54 PM   Specimen: Nasopharyngeal Swab  Result Value Ref Range Status   SARS Coronavirus 2 NEGATIVE NEGATIVE Final    Comment: (NOTE) SARS-CoV-2 target nucleic acids are NOT DETECTED. The SARS-CoV-2 RNA is generally detectable in upper and lower respiratory specimens during the acute phase of infection. The lowest concentration of SARS-CoV-2 viral copies this assay can detect is 250 copies / mL. A negative result does not preclude SARS-CoV-2 infection and should not be used as the sole basis for treatment or other patient management decisions.  A negative result may occur with improper specimen collection / handling, submission of specimen other than nasopharyngeal swab, presence of viral mutation(s) within the areas targeted by this assay, and inadequate number of viral copies (<250 copies / mL). A negative result must be combined with clinical observations, patient history, and epidemiological information. Fact Sheet for Patients:   StrictlyIdeas.no Fact Sheet for Healthcare Providers: BankingDealers.co.za This test is not yet approved or cleared  by the [place] FDA and has been authorized for detection and/or diagnosis of SARS-CoV-2 by FDA under an Emergency Use Authorization (EUA).  This EUA will remain in effect (meaning this test can be used) for the duration of the COVID-19 declaration under Section 564(b)(1) of the Act, 21 U.S.C. section 360bbb-3(b)(1), unless the authorization is terminated or revoked sooner. Performed at [facility], [address] [place], [place] [address]          Radiology Studies: Dg Chest  [place] 1 View  Result Date: 03/04/2019 CLINICAL DATA:  63 year old male with STEMI. EXAM: PORTABLE CHEST 1 VIEW COMPARISON:  None. FINDINGS: There is no focal consolidation, pleural effusion, or pneumothorax. The cardiac silhouette is within normal limits. Median sternotomy wires and CABG vascular clips. No acute osseous pathology. IMPRESSION: No active disease. Electronically Signed   By: Anner Crete M.D.   On: 03/04/2019 23:31        Scheduled Meds: . aspirin  81 mg Oral Daily  . atorvastatin  40 mg Oral q1800  . carvedilol  3.125 mg Oral BID WC  . Chlorhexidine Gluconate Cloth  6 each Topical Q0600  . heparin  4,000 Units Intravenous Once  . sodium chloride flush  3 mL Intravenous Q12H  . sodium chloride flush  3 mL Intravenous Q12H  . sodium chloride flush  3 mL Intravenous Q12H  . ticagrelor  90 mg Oral BID   Continuous Infusions: . sodium chloride 10 mL/hr (03/04/19 2124)  . sodium  chloride    . sodium chloride 1 mL/kg/hr (03/05/19 0500)     LOS: 1 day    Time spent: 35 mins    Charise KillianJamiese M Radek Carnero, MD [facility] Pager [phone]  If 7PM-7AM, please contact night-coverage [email or web address] Password [email or web address] 03/05/2019, 8:38 AM

## 2019-03-05 NOTE — Progress Notes (Signed)
*  PRELIMINARY RESULTS* Echocardiogram 2D Echocardiogram has been performed.  Jose Savage Jose Savage 04/24/2018, 9:49 AM 

## 2019-03-06 ENCOUNTER — Encounter
Admission: EM | Disposition: A | Discharge status: Home / Self Care | Payer: Self-pay | Source: Home / Self Care | Attending: Internal Medicine

## 2019-03-06 DIAGNOSIS — I213 ST elevation (STEMI) myocardial infarction of unspecified site: Secondary | ICD-10-CM

## 2019-03-06 LAB — CBC WITH DIFFERENTIAL/PLATELET
Abs Immature Granulocytes: 0.04 10*3/uL (ref 0.00–0.07)
Abs Immature Granulocytes: 0.04 10*3/uL (ref 0.00–0.07)
Basophils Absolute: 0.1 10*3/uL (ref 0.0–0.1)
Basophils Absolute: 0.1 10*3/uL (ref 0.0–0.1)
Basophils Relative: 1 %
Basophils Relative: 0 %
Eosinophils Absolute: 0 10*3/uL (ref 0.0–0.5)
Eosinophils Absolute: 0 10*3/uL (ref 0.0–0.5)
Eosinophils Relative: 0 %
Eosinophils Relative: 0 %
HCT: 44.5 % (ref 39.0–52.0)
HCT: 45.5 % (ref 39.0–52.0)
Hemoglobin: 14.7 g/dL (ref 13.0–17.0)
Hemoglobin: 15.6 g/dL (ref 13.0–17.0)
Immature Granulocytes: 0 %
Immature Granulocytes: 0 %
Lymphocytes Relative: 24 %
Lymphocytes Relative: 16 %
Lymphs Abs: 2.9 10*3/uL (ref 0.7–4.0)
Lymphs Abs: 2.1 10*3/uL (ref 0.7–4.0)
MCH: 28.9 pg (ref 26.0–34.0)
MCH: 28.9 pg (ref 26.0–34.0)
MCHC: 33 g/dL (ref 30.0–36.0)
MCHC: 34.3 g/dL (ref 30.0–36.0)
MCV: 87.4 fL (ref 80.0–100.0)
MCV: 84.4 fL (ref 80.0–100.0)
Monocytes Absolute: 1.1 10*3/uL — ABNORMAL HIGH (ref 0.1–1.0)
Monocytes Absolute: 1.1 10*3/uL — ABNORMAL HIGH (ref 0.1–1.0)
Monocytes Relative: 9 %
Monocytes Relative: 8 %
Neutro Abs: 8.1 10*3/uL — ABNORMAL HIGH (ref 1.7–7.7)
Neutro Abs: 10.4 10*3/uL — ABNORMAL HIGH (ref 1.7–7.7)
Neutrophils Relative %: 66 %
Neutrophils Relative %: 76 %
Platelets: 26 10*3/uL — CL (ref 150–400)
Platelets: 27 10*3/uL — CL (ref 150–400)
RBC: 5.09 MIL/uL (ref 4.22–5.81)
RBC: 5.39 MIL/uL (ref 4.22–5.81)
RDW: 14.1 % (ref 11.5–15.5)
RDW: 14.3 % (ref 11.5–15.5)
WBC: 12.2 10*3/uL — ABNORMAL HIGH (ref 4.0–10.5)
WBC: 13.7 10*3/uL — ABNORMAL HIGH (ref 4.0–10.5)
nRBC: 0 % (ref 0.0–0.2)
nRBC: 0 % (ref 0.0–0.2)

## 2019-03-06 LAB — COMPREHENSIVE METABOLIC PANEL
ALT: 24 U/L (ref 0–44)
AST: 93 U/L — ABNORMAL HIGH (ref 15–41)
Albumin: 3.3 g/dL — ABNORMAL LOW (ref 3.5–5.0)
Alkaline Phosphatase: 70 U/L (ref 38–126)
Anion gap: 9 (ref 5–15)
BUN: 8 mg/dL (ref 8–23)
CO2: 23 mmol/L (ref 22–32)
Calcium: 8.4 mg/dL — ABNORMAL LOW (ref 8.9–10.3)
Chloride: 107 mmol/L (ref 98–111)
Creatinine, Ser: 0.84 mg/dL (ref 0.61–1.24)
GFR calc Af Amer: >60 mL/min (ref >60)
GFR calc non Af Amer: >60 mL/min (ref >60)
Glucose, Bld: 97 mg/dL (ref 70–99)
Potassium: 3.6 mmol/L (ref 3.5–5.1)
Sodium: 139 mmol/L (ref 135–145)
Total Bilirubin: 0.7 mg/dL (ref 0.3–1.2)
Total Protein: 6.3 g/dL — ABNORMAL LOW (ref 6.5–8.1)

## 2019-03-06 LAB — TYPE AND SCREEN
ABO/RH(D): O NEG
Antibody Screen: NEGATIVE

## 2019-03-06 LAB — IMMATURE PLATELET FRACTION: Immature Platelet Fraction: 12.1 % — ABNORMAL HIGH (ref 1.2–8.6)

## 2019-03-06 LAB — FIBRINOGEN: Fibrinogen: 500 mg/dL — ABNORMAL HIGH (ref 210–475)

## 2019-03-06 LAB — TROPONIN I (HIGH SENSITIVITY)
Troponin I (High Sensitivity): 8385 ng/L (ref <18)
Troponin I (High Sensitivity): 8783 ng/L (ref <18)

## 2019-03-06 LAB — VITAMIN B12: Vitamin B-12: 194 pg/mL (ref 180–914)

## 2019-03-06 LAB — LACTATE DEHYDROGENASE: LDH: 341 U/L — ABNORMAL HIGH (ref 98–192)

## 2019-03-06 SURGERY — LEFT HEART CATH
Anesthesia: Moderate Sedation

## 2019-03-06 SURGERY — LEFT HEART CATH
Anesthesia: Moderate Sedation | Laterality: Left

## 2019-03-06 MED ORDER — ALUM & MAG HYDROXIDE-SIMETH 200-200-20 MG/5ML PO SUSP
30.0000 mL | Freq: Once | ORAL | Status: AC
  Administered 2019-03-06: 30 mL via ORAL
  Filled 2019-03-06: qty 30

## 2019-03-06 MED ORDER — COLCHICINE 0.6 MG PO TABS
0.6000 mg | ORAL_TABLET | Freq: Two times a day (BID) | ORAL | Status: DC
  Administered 2019-03-06 – 2019-03-07 (×3): 0.6 mg via ORAL
  Filled 2019-03-06 (×6): qty 1

## 2019-03-06 MED ORDER — TRAMADOL HCL 50 MG PO TABS
50.0000 mg | ORAL_TABLET | Freq: Two times a day (BID) | ORAL | Status: DC | PRN
  Administered 2019-03-06: 50 mg via ORAL
  Filled 2019-03-06 (×2): qty 1

## 2019-03-06 MED ORDER — ALUM & MAG HYDROXIDE-SIMETH 200-200-20 MG/5ML PO SUSP
30.0000 mL | ORAL | Status: DC | PRN
  Administered 2019-03-06 (×2): 30 mL via ORAL
  Filled 2019-03-06 (×2): qty 30

## 2019-03-06 MED ORDER — COLCHICINE 0.6 MG PO TABS
0.6000 mg | ORAL_TABLET | Freq: Every day | ORAL | Status: DC
  Administered 2019-03-06: 0.6 mg via ORAL
  Filled 2019-03-06: qty 1

## 2019-03-06 NOTE — Progress Notes (Signed)
[facility] Cardiology  SUBJECTIVE: Patient laying in bed, denies chest pain or shortness of breath.  He reports an episode of pleuritic chest discomfort last evening, without recurrence   Vitals:   03/06/19 0500 03/06/19 0600 03/06/19 0642 03/06/19 0700  BP: 117/68 126/79  127/81  Pulse: 61 66 63 65  Resp: 13 (!) 27 17 16   Temp:  98.1 F (36.7 C)    TempSrc:  Oral    SpO2: 94% 96% 93% 93%  Weight:      Height:         Intake/Output Summary (Last 24 hours) at 03/06/2019 0810 Last data filed at 03/05/2019 2300 Gross per 24 hour  Intake 6 ml  Output 2125 ml  Net -2119 ml      PHYSICAL EXAM  General: Well developed, well nourished, in no acute distress HEENT:  Normocephalic and atramatic Neck:  No JVD.  Lungs: Clear bilaterally to auscultation and percussion. Heart: HRRR . Normal S1 and S2 without gallops or murmurs.  Abdomen: Bowel sounds are positive, abdomen soft and non-tender  Msk:  Back normal, normal gait. Normal strength and tone for age. Extremities: No clubbing, cyanosis or edema.   Neuro: Alert and oriented X 3. Psych:  Good affect, responds appropriately   LABS: Basic Metabolic Panel: Recent Labs    03/04/19 2112 03/06/19 0424  NA 134* 139  K 3.9 3.6  CL 99 107  CO2 26 23  GLUCOSE 138* 97  BUN 15 8  CREATININE 0.96 0.84  CALCIUM 9.8 8.4*   Liver Function Tests: Recent Labs    03/04/19 2112 03/06/19 0424  AST 18 93*  ALT 19 24  ALKPHOS 100 70  BILITOT 0.5 0.7  PROT 7.6 6.3*  ALBUMIN 4.0 3.3*   No results for input(s): LIPASE, AMYLASE in the last 72 hours. CBC: Recent Labs    03/04/19 2112 03/06/19 0424  WBC 15.9* 12.2*  NEUTROABS 11.8* 8.1*  HGB 17.0 14.7  HCT 50.5 44.5  MCV 86.2 87.4  PLT 239 26*   Cardiac Enzymes: No results for input(s): CKTOTAL, CKMB, CKMBINDEX, TROPONINI in the last 72 hours. BNP: Invalid input(s): POCBNP D-Dimer: No results for input(s): DDIMER in the last 72 hours. Hemoglobin A1C: No results for input(s):  HGBA1C in the last 72 hours. Fasting Lipid Panel: Recent Labs    03/04/19 2112  CHOL 158  HDL 30*  LDLCALC 108*  TRIG 102  CHOLHDL 5.3   Thyroid Function Tests: No results for input(s): TSH, T4TOTAL, T3FREE, THYROIDAB in the last 72 hours.  Invalid input(s): FREET3 Anemia Panel: No results for input(s): VITAMINB12, FOLATE, FERRITIN, TIBC, IRON, RETICCTPCT in the last 72 hours.  Dg Chest Port 1 View  Result Date: 03/04/2019 CLINICAL DATA:  63 year old male with STEMI. EXAM: PORTABLE CHEST 1 VIEW COMPARISON:  None. FINDINGS: There is no focal consolidation, pleural effusion, or pneumothorax. The cardiac silhouette is within normal limits. Median sternotomy wires and CABG vascular clips. No acute osseous pathology. IMPRESSION: No active disease. Electronically Signed   By: Anner Crete M.D.   On: 03/04/2019 23:31     Echo pending  TELEMETRY: Sinus rhythm:  ASSESSMENT AND PLAN:  Active Problems:   STEMI involving oth coronary artery of inferior wall (HCC)   STEMI (ST elevation myocardial infarction) ([place])    1.  Inferior STEMI 2.  Coronary artery disease, chronically occluded mid to distal RCA, chronically occluded mid left circumflex, atretic LIMA to LAD with patent native vessel, occluded SVG to distal RCA with  diffuse heavy thrombus burden 3.  Thrombocytopenia secondary to Aggrastat infusion 4.  Pleuritic chest pain  Recommendations  1.  Agree with overall current therapy 2.  DC Aggrastat drip 3.  Cancel cardiac catheterization.  The risks of relook cardiac catheterization and possible vascularization of SVG to distal RCA, likely outweighs the benefits due to complex anatomy, probable completion of infarct, and marked thrombocytopenia secondary to Aggrastat infusion. 4.  Continue DAPT for now 5.  Continue to monitor platelet count 6.  Add colchicine for possible pericarditis 7.  Transfer to telemetry 8.  Likely discharge in a.m. 9.  Follow-up with primary  care/cardiologist in [facility] 10.  Patient is instructed to enroll in cardiac rehabilitation, likely return to work in 2 to 3 weeks   Marcina Millard, MD, PhD, [facility] 03/06/2019 8:10 AM

## 2019-03-06 NOTE — Progress Notes (Signed)
Paged Dr. Ileene Musa and notified her of patient's condition. She stated we should continue the Morphine IV as needed. Patient is resting at this time with his eyes closed, respirations even and unlabored. Ill continue to monitor.

## 2019-03-06 NOTE — Progress Notes (Signed)
Cardiovascular and Pulmonary Nurse Navigator Note:    63 year old male with past medical history of coronary artery disease s/p stent many years ago, CABG x 4 in 2014, hypertension, and hyperlipidemia who presented to the ED with mid-sternal chest pain radiating to his right shoulder.  Patient took 4 aspirin and a nitroglycerin tablet prior to calling EMS.  Patient ruled in for STEMI and underwent an emergent cardiac catheterization.    Panel Physicians Referring Physician Case Authorizing Physician  Paraschos, Alexander, MD (Primary)    Procedures  Coronary/Graft Acute MI Revascularization  LEFT HEART CATH AND CORONARY ANGIOGRAPHY  Conclusion    Prox Cx to Mid Cx lesion is 100% stenosed.  Prox LAD lesion is 40% stenosed.  Mid LAD-1 lesion is 40% stenosed.  Mid LAD-2 lesion is 40% stenosed.  Origin to Prox Graft lesion is 100% stenosed.  Dist RCA lesion is 100% stenosed.  Prox RCA lesion is 60% stenosed.  Prox RCA to Mid RCA lesion is 70% stenosed.  Prox Graft lesion is 100% stenosed.  Previously placed Mid Cx to Dist Cx stent (unknown type) is widely patent.   1.  Severe two-vessel coronary artery disease with chronically occluded mid to distal RCA, chronic occluded mid left circumflex 2.  Atretic LIMA to LAD with patent native LAD 3.  Occluded SVG to distal RCA with diffuse heavy thrombus burden  Recommendations  1.  Initial conservative management 2.  Dual antiplatelet therapy 3.  Aggrastat bolus and infusion 4.  Possible repeat relook cardiac catheterization 03/05/2019  Echo performed yesterday.  Results pending.    Rounded on patient.  Patient lying in bed watching TV.  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  Patient is hoping to be discharged tomorrow.  The company he works for is going to drive him home when he is discharged.   Brilinta Coupon given.    Copy of Cardiac Cath disk obtained from Cardiac Cath lab and given to patient.  This RN requested Warden/ranger to assist with obtaining copy of his medical records for him to take with him when he is discharged.    Education:   "Heart Attack Bouncing Back" booklet given and reviewed with patient.  CAD is not a new diagnosis for this patient.   ? Discussed modifiable risk factors including controlling blood pressure, cholesterol, and blood sugar; following heart healthy diet; maintaining healthy weight; exercise; and smoking cessation, if applicable.   ? Discussed cardiac medications including rationale for taking, mechanisms of action, and side effects. Stressed the importance of taking medications as prescribed. Patient is concerned about the cost of medications.  Brilinta coupon given to patient.   ? Discussed emergency plan for heart attack symptoms. Patient verbalized understanding of need to call 911 and not to drive himself to ER if having cardiac symptoms / chest pain.  Note:  Patient did call 911 with this event.   Patient reported he did take Nitroglycerin Sublingual before calling 911.  Patient keeps nitroglycerin with him at all times.  ? Diet of low sodium, low fat, low cholesterol heart healthy  diet discussed. Information on diet provided. Patient stated he did not need to see dietitian, as none of this is new for him and he has been watching his diet and trying to eat a heart healthy diet while on the road.    Smoking Cessation - Patient is a FORMER smoker.    ? Exercise - Benefits of exercised discussed. 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  Patient tries to walk when he makes stops. Explained to patient the recommendation for outpatient Cardiac Rehab.  Patient stated with his job that is just not an option for him.  Reviewed the American Heart Association guidelines for exercise of 150 minutes of moderate exercise per week.  Encouraged patient to remain as active as possible.    Patient to follow-up with his PCP / Cardiologist in  [place].  Patient informed this RN that he does not have a PCP and feels that seeing a PCP is a waste of money.  He has not had a PCP in years.  Patient informed this RN that he will go see his cardiologist, as his cardiologist must sign off for him to return to work.   ?  Patient appreciative of the information.  ? Roanna Epley, RN, BSN, [place] Cardiac & Pulmonary Rehab  Cardiovascular & Pulmonary Nurse Navigator  Direct Line: [phone]  Department Phone #: [phone] Fax: [phone]  Email Address: [email or web address]

## 2019-03-06 NOTE — Progress Notes (Signed)
Patient's platelet level dropped from 239 to 26. Agrostat Iv infusion stopped. No bleeding/brusing/hmatoma noted.  Bodenheimer NP paged to notify and awaiting a call back.

## 2019-03-06 NOTE — Progress Notes (Signed)
Writer @ bedside for PIV placement per consult. On arrival, patient with questions concerning rationale for additional PIV. Primary care RN called to bedside and educated patient that 2nd IV is needed for cath lab procedure today. Patient refused, stated " No, this is my body and I said no". Primary care RN instructed to consult VAST if needed and patient agreeable.

## 2019-03-06 NOTE — Progress Notes (Signed)
[facility] Cardiology  SUBJECTIVE: Complaint of chest pain symptoms EKG was performed which was essentially unchanged unremarkable patient was treated with reflux medication also received morphine with no significant improvement he has been on colchicine as well as tramadol.  Still complains of minimal right groin discomfort pain feels different than his original presenting unstable anginal symptoms.  Patient is hoping to maybe be discharged tomorrow   Vitals:   03/06/19 0700 03/06/19 0800 03/06/19 0900 03/06/19 1000  BP: 127/81 (!) 120/100 115/84 127/79  Pulse: 65 64 62 70  Resp: 16 18 17  (!) 21  Temp:  98.3 F (36.8 C)    TempSrc:  Oral    SpO2: 93% 95% 95% 94%  Weight:      Height:         Intake/Output Summary (Last 24 hours) at 03/06/2019 1642 Last data filed at 03/06/2019 1000 Gross per 24 hour  Intake 416.62 ml  Output 2125 ml  Net -1708.38 ml      PHYSICAL EXAM  General: Well developed, well nourished, in no acute distress HEENT:  Normocephalic and atramatic Neck:  No JVD.  Lungs: Clear bilaterally to auscultation and percussion. Heart: HRRR . Normal S1 and S2 without gallops or murmurs.  Abdomen: Bowel sounds are positive, abdomen soft and non-tender  Msk:  Back normal, normal gait. Normal strength and tone for age. Extremities: No clubbing, cyanosis or edema.   Neuro: Alert and oriented X 3. Psych:  Good affect, responds appropriately   LABS: Basic Metabolic Panel: Recent Labs    03/04/19 2112 03/06/19 0424  NA 134* 139  K 3.9 3.6  CL 99 107  CO2 26 23  GLUCOSE 138* 97  BUN 15 8  CREATININE 0.96 0.84  CALCIUM 9.8 8.4*   Liver Function Tests: Recent Labs    03/04/19 2112 03/06/19 0424  AST 18 93*  ALT 19 24  ALKPHOS 100 70  BILITOT 0.5 0.7  PROT 7.6 6.3*  ALBUMIN 4.0 3.3*   No results for input(s): LIPASE, AMYLASE in the last 72 hours. CBC: Recent Labs    03/06/19 0424 03/06/19 1047  WBC 12.2* 13.7*  NEUTROABS 8.1* 10.4*  HGB 14.7 15.6   HCT 44.5 45.5  MCV 87.4 84.4  PLT 26* 27*   Cardiac Enzymes: No results for input(s): CKTOTAL, CKMB, CKMBINDEX, TROPONINI in the last 72 hours. BNP: Invalid input(s): POCBNP D-Dimer: No results for input(s): DDIMER in the last 72 hours. Hemoglobin A1C: No results for input(s): HGBA1C in the last 72 hours. Fasting Lipid Panel: Recent Labs    03/04/19 2112  CHOL 158  HDL 30*  LDLCALC 108*  TRIG 102  CHOLHDL 5.3   Thyroid Function Tests: No results for input(s): TSH, T4TOTAL, T3FREE, THYROIDAB in the last 72 hours.  Invalid input(s): FREET3 Anemia Panel: No results for input(s): VITAMINB12, FOLATE, FERRITIN, TIBC, IRON, RETICCTPCT in the last 72 hours.  Dg Chest Port 1 View  Result Date: 03/04/2019 CLINICAL DATA:  63 year old male with STEMI. EXAM: PORTABLE CHEST 1 VIEW COMPARISON:  None. FINDINGS: There is no focal consolidation, pleural effusion, or pneumothorax. The cardiac silhouette is within normal limits. Median sternotomy wires and CABG vascular clips. No acute osseous pathology. IMPRESSION: No active disease. Electronically Signed   By: 76 M.D.   On: 03/04/2019 23:31     Echo no change  TELEMETRY: Sinus rhythm nonspecific ST-T wave changes  ASSESSMENT AND PLAN:  Active Problems:   STEMI involving oth coronary artery of inferior wall (HCC)  STEMI (ST elevation myocardial infarction) ([place])    Unstable angina    recurrent chest pain symptoms     Thrombocytopenia      Occluded vein graft      Reflux symptoms      Possible inflammation pericardial or chest wall  . Plan Continue current medical therapy Continue to hold off on Aggrastat Follow-up with platelet count consider hematology involvement Increase activity ambulate in the halls Advance medical therapy If symptoms reasonably stable consider discharge within the next 24 to 48 hours    Yolonda Kida, MD,  03/06/2019 4:42 PM

## 2019-03-06 NOTE — Progress Notes (Addendum)
. [facility] Progress Note  Patient: Jose Savage NWG:[id number]   PCP: Patient, No Pcp Per DOB: 06-May-1955   DOA: 03/04/2019   DOS: 03/06/2019   Date of Service: the patient was seen and examined on 03/06/2019  Chief Complaint  Patient presents with  . Code STEMI   Brief hospital course: Tracker Mance is a 63 y.o. male with medical history significant of CAD s/p stent (many years ago) and CABG x 4 in 2014, hypertension and hyperlipidemia who presented with symptoms of mid-sternal pressure chest pain that radiated to his right shoulder. He at first thought it was indigestion and tried tums without relieve. Later took aspirin x4  and nitro. Pain lasted 4 hours until he drove through [place] on his truck and called EMS.  EKG showed ST elevations in inferior leads consistent with inferior ST elevation MI and CODE STEMI was activated.   Currently further plan is post-cath improvement.  Subjective: Continues to have some chest pain.  No nausea no vomiting no fever no chills.  No shortness of breath.  Assessment and Plan: Scheduled Meds: . aspirin  81 mg Oral Daily  . atorvastatin  40 mg Oral q1800  . carvedilol  3.125 mg Oral BID WC  . Chlorhexidine Gluconate Cloth  6 each Topical Q0600  . colchicine  0.6 mg Oral BID  . heart attack bouncing book   Does not apply Once  . sodium chloride flush  3 mL Intravenous Q12H  . sodium chloride flush  3 mL Intravenous Q12H  . sodium chloride flush  3 mL Intravenous Q12H  . sodium chloride flush  3 mL Intravenous Q12H  . ticagrelor  90 mg Oral BID   Continuous Infusions: . sodium chloride 10 mL/hr at 03/06/19 1800  . sodium chloride    . sodium chloride    . sodium chloride    . sodium chloride 1 mL/kg/hr (03/05/19 2319)  . sodium chloride     PRN Meds: sodium chloride, sodium chloride, sodium chloride, acetaminophen, alum & mag hydroxide-simeth, morphine injection **OR** morphine injection, ondansetron (ZOFRAN) IV, sodium chloride  flush, sodium chloride flush, sodium chloride flush, traMADol   Inferior wall STEMI -S/p cardiac cath 12/2 with findings of severe two-vessel CAD, treated LIMA to LAD, occluded SVG to distal RCA with diffuse heavy thrombus burden -Continuous telemetry monitoring -Cardiology recommends initial conservative management -Continue dual antiplatelet therapy with aspirin and Brilinta  Hypotension - Maintain MAP >65  - Hold Losartan for now  Hyperlipidemia -Continue Lipitor  Acute thrombocytopenia. Likely secondary to Aggrastat. No active bleeding reported by the patient. Work-up does not show any evidence of acute hemolysis. We will monitor.  Toxic granulation on peripheral smear. No evidence of active bleeding or pneumonia or infection. Continue to monitor for now.  Acute pericarditis. Colchicine started by cardiology.  Diet: Cardiac diet and Carb modified diet  DVT Prophylaxis: Subcutaneous Heparin    Advance goals of care discussion: Full code  Family Communication: no family was present at bedside, at the time of interview.   Disposition:  Discharge to home.  Consultants: Cardiology  Procedures: cath  Antibiotics: Anti-infectives (From admission, onward)   None       Objective: Physical Exam: Vitals:   03/06/19 1500 03/06/19 1600 03/06/19 1700 03/06/19 1800  BP:      Pulse: 64 62 65 67  Resp: 14 (!) 25 20 (!) 22  Temp:      TempSrc:      SpO2: 96% 95% 97% 95%  Weight:  Height:        Intake/Output Summary (Last 24 hours) at 03/06/2019 1948 Last data filed at 03/06/2019 1800 Gross per 24 hour  Intake 2786.62 ml  Output 1425 ml  Net 1361.62 ml   Filed Weights   03/04/19 2113 03/05/19 0000 03/05/19 1350  Weight: 88.5 kg 82.5 kg 82.5 kg   General: alert and oriented to time, place, and person. Appear in mild distress, affect appropriate Eyes: PERRL, Conjunctiva normal ENT: Oral Mucosa Clear, moist  Neck: no JVD, no Abnormal Mass Or lumps  Cardiovascular: S1 and S2 Present, no Murmur,  Respiratory: good respiratory effort, Bilateral Air entry equal and Decreased, no signs of accessory muscle use, Clear to Auscultation, no Crackles, no wheezes Abdomen: Bowel Sound present, Soft and no tenderness, no hernia Skin: no rashes  Extremities: no Pedal edema, no calf tenderness Neurologic: without any new focal findings Gait not checked due to patient safety concerns  Data Reviewed: I have personally reviewed and interpreted daily labs, tele strips, imagings as discussed above. I reviewed all nursing notes, pharmacy notes, vitals, pertinent old records I have discussed plan of care as described above with RN and patient/family.  CBC: Recent Labs  Lab 03/04/19 2112 03/06/19 0424 03/06/19 1047  WBC 15.9* 12.2* 13.7*  NEUTROABS 11.8* 8.1* 10.4*  HGB 17.0 14.7 15.6  HCT 50.5 44.5 45.5  MCV 86.2 87.4 84.4  PLT 239 26* 27*   Basic Metabolic Panel: Recent Labs  Lab 03/04/19 2112 03/06/19 0424  NA 134* 139  K 3.9 3.6  CL 99 107  CO2 26 23  GLUCOSE 138* 97  BUN 15 8  CREATININE 0.96 0.84  CALCIUM 9.8 8.4*    Liver Function Tests: Recent Labs  Lab 03/04/19 2112 03/06/19 0424  AST 18 93*  ALT 19 24  ALKPHOS 100 70  BILITOT 0.5 0.7  PROT 7.6 6.3*  ALBUMIN 4.0 3.3*   No results for input(s): LIPASE, AMYLASE in the last 168 hours. No results for input(s): AMMONIA in the last 168 hours. Coagulation Profile: Recent Labs  Lab 03/04/19 2112  INR 1.0   Cardiac Enzymes: No results for input(s): CKTOTAL, CKMB, CKMBINDEX, TROPONINI in the last 168 hours. BNP (last 3 results) No results for input(s): PROBNP in the last 8760 hours. CBG: Recent Labs  Lab 03/04/19 2248  GLUCAP 123*   Studies: No results found.   Time spent: 35 minutes  Author: Berle Mull, MD [facility] 03/06/2019 7:48 PM  To reach On-call, see care teams to locate the attending and reach out to them via [email or web address]. If 7PM-7AM,  please contact night-coverage If you still have difficulty reaching the attending provider, please page the [facility] (Director on Call) for [facility] on amion for assistance.

## 2019-03-06 NOTE — Progress Notes (Signed)
Noted ST elevation on lead II and patient also complained of chest pain of 8/10 on a pain scale. Morphine 2 mg IV administered. Has paged Bodenheimer NP x 2 and awaiting a call back. Will continue to monitor.

## 2019-03-06 NOTE — Progress Notes (Signed)
Patient refused PIV for no reason and rejected the rational for it.

## 2019-03-07 LAB — CBC WITH DIFFERENTIAL/PLATELET
Abs Immature Granulocytes: 0.05 10*3/uL (ref 0.00–0.07)
Basophils Absolute: 0.1 10*3/uL (ref 0.0–0.1)
Basophils Relative: 0 %
Eosinophils Absolute: 0 10*3/uL (ref 0.0–0.5)
Eosinophils Relative: 0 %
HCT: 47.4 % (ref 39.0–52.0)
Hemoglobin: 15.8 g/dL (ref 13.0–17.0)
Immature Granulocytes: 0 %
Lymphocytes Relative: 14 %
Lymphs Abs: 2.3 10*3/uL (ref 0.7–4.0)
MCH: 28.9 pg (ref 26.0–34.0)
MCHC: 33.3 g/dL (ref 30.0–36.0)
MCV: 86.8 fL (ref 80.0–100.0)
Monocytes Absolute: 1.5 10*3/uL — ABNORMAL HIGH (ref 0.1–1.0)
Monocytes Relative: 9 %
Neutro Abs: 11.8 10*3/uL — ABNORMAL HIGH (ref 1.7–7.7)
Neutrophils Relative %: 77 %
Platelets: 57 10*3/uL — ABNORMAL LOW (ref 150–400)
RBC: 5.46 MIL/uL (ref 4.22–5.81)
RDW: 14.2 % (ref 11.5–15.5)
WBC: 15.6 10*3/uL — ABNORMAL HIGH (ref 4.0–10.5)
nRBC: 0 % (ref 0.0–0.2)

## 2019-03-07 LAB — COMPREHENSIVE METABOLIC PANEL
ALT: 23 U/L (ref 0–44)
AST: 63 U/L — ABNORMAL HIGH (ref 15–41)
Albumin: 3.5 g/dL (ref 3.5–5.0)
Alkaline Phosphatase: 71 U/L (ref 38–126)
Anion gap: 9 (ref 5–15)
BUN: 9 mg/dL (ref 8–23)
CO2: 22 mmol/L (ref 22–32)
Calcium: 8.7 mg/dL — ABNORMAL LOW (ref 8.9–10.3)
Chloride: 102 mmol/L (ref 98–111)
Creatinine, Ser: 0.88 mg/dL (ref 0.61–1.24)
GFR calc Af Amer: >60 mL/min (ref >60)
GFR calc non Af Amer: >60 mL/min (ref >60)
Glucose, Bld: 130 mg/dL — ABNORMAL HIGH (ref 70–99)
Potassium: 3.7 mmol/L (ref 3.5–5.1)
Sodium: 133 mmol/L — ABNORMAL LOW (ref 135–145)
Total Bilirubin: 1 mg/dL (ref 0.3–1.2)
Total Protein: 7 g/dL (ref 6.5–8.1)

## 2019-03-07 LAB — HAPTOGLOBIN: Haptoglobin: 233 mg/dL (ref 32–363)

## 2019-03-07 MED ORDER — VITAMIN B-12 1000 MCG PO TABS: 1000.0000 ug | ORAL_TABLET | Freq: Every day | ORAL | Status: DC

## 2019-03-07 MED ORDER — CYANOCOBALAMIN 1000 MCG/ML IJ SOLN
1000.0000 ug | Freq: Once | INTRAMUSCULAR | Status: AC
  Administered 2019-03-07: 1000 ug via SUBCUTANEOUS
  Filled 2019-03-07: qty 1

## 2019-03-07 NOTE — Progress Notes (Signed)
Pt is A&OX4, denies having any pain or SOB.

## 2019-03-07 NOTE — Progress Notes (Signed)
. [facility] Progress Note  Jose Savage: Jose Savage [id number]   PCP: Jose Savage, No Pcp Per DOB: 06-06-1955   DOA: 03/04/2019   DOS: 03/07/2019   Date of Service: the Jose Savage was seen and examined on 03/07/2019  Chief Complaint  Jose Savage presents with  . Code STEMI   Brief hospital course: [name] is a 63 y.o. male with medical history significant of CAD s/p stent (many years ago) and CABG x 4 in 2014, hypertension and hyperlipidemia who presented with symptoms of mid-sternal pressure chest pain that radiated to his right shoulder. He at first thought it was indigestion and tried tums without relieve. Later took aspirin x4  and nitro. Pain lasted 4 hours until he drove through [place] on his truck and called EMS.  EKG showed ST elevations in inferior leads consistent with inferior ST elevation MI and CODE STEMI was activated.  Jose Savage developed Aggrastat induced thrombocytopenia and it was stopped.  Currently further plan is closely monitor for platelet count improvement.  Subjective: No further chest pain.  No nausea no vomiting or shortness of breath.  No dizziness or lightheadedness. Jose Savage denies any bleeding anywhere.  Jose Savage denies any melena.  Assessment and Plan: Scheduled Meds: . aspirin  81 mg Oral Daily  . atorvastatin  40 mg Oral q1800  . carvedilol  3.125 mg Oral BID WC  . Chlorhexidine Gluconate Cloth  6 each Topical Q0600  . colchicine  0.6 mg Oral BID  . heart attack bouncing book   Does not apply Once  . sodium chloride flush  3 mL Intravenous Q12H  . sodium chloride flush  3 mL Intravenous Q12H  . sodium chloride flush  3 mL Intravenous Q12H  . sodium chloride flush  3 mL Intravenous Q12H  . ticagrelor  90 mg Oral BID  . [START ON 03/08/2019] vitamin B-12  1,000 mcg Oral Daily   Continuous Infusions: . sodium chloride 10 mL/hr at 03/06/19 1800  . sodium chloride    . sodium chloride    . sodium chloride    . sodium chloride 1 mL/kg/hr (03/05/19  2319)  . sodium chloride     PRN Meds: sodium chloride, sodium chloride, sodium chloride, acetaminophen, alum & mag hydroxide-simeth, morphine injection **OR** morphine injection, ondansetron (ZOFRAN) IV, sodium chloride flush, sodium chloride flush, sodium chloride flush, traMADol   Inferior wall STEMI -S/p cardiac cath 12/2 with findings of severe two-vessel CAD, treated LIMA to LAD, occluded SVG to distal RCA with diffuse heavy thrombus burden -Continuous telemetry monitoring -Cardiology recommends initial conservative management -Continue dual antiplatelet therapy with aspirin and Brilinta Jose Savage was treated with Aggrastat but due to severe thrombocytopenia it was discontinued.  Essential hypertension. Blood pressure currently soft. - Maintain MAP >65  - Hold Losartan for now  Hyperlipidemia -Continue Lipitor  Acute thrombocytopenia. Likely secondary to Aggrastat. No active bleeding reported by the Jose Savage. Work-up does not show any evidence of acute hemolysis. Repeat CBC shows improvement in the count.  Toxic granulation on peripheral smear. No evidence of active bleeding or pneumonia or infection. Continue to monitor for now.  Acute pericarditis. Colchicine started by cardiology.  Diet: Cardiac diet and Carb modified diet  DVT Prophylaxis: Subcutaneous Heparin    Advance goals of care discussion: Full code  Family Communication: no family was present at bedside, at the time of interview.   Disposition:  Discharge to home.  Consultants: Cardiology  Procedures: cath  Antibiotics: Anti-infectives (From admission, onward)   None  Objective: Physical Exam: Vitals:   03/07/19 0800 03/07/19 0822 03/07/19 1200 03/07/19 1600  BP: 108/73 108/73 111/75 101/66  Pulse: 69 74 67 63  Resp: 14  20 20   Temp: 98.4 F (36.9 C)     TempSrc: Oral     SpO2: 96%  90% 94%  Weight:      Height:        Intake/Output Summary (Last 24 hours) at 03/07/2019 1807  Last data filed at 03/07/2019 0900 Gross per 24 hour  Intake -  Output 1 ml  Net -1 ml   Filed Weights   03/04/19 2113 03/05/19 0000 03/05/19 1350  Weight: 88.5 kg 82.5 kg 82.5 kg   General: alert and oriented to time, place, and person. Appear in mild distress, affect appropriate Eyes: PERRL, Conjunctiva normal ENT: Oral Mucosa Clear, moist  Neck: no JVD, no Abnormal Mass Or lumps Cardiovascular: S1 and S2 Present, no Murmur,  Respiratory: good respiratory effort, Bilateral Air entry equal and Decreased, no signs of accessory muscle use, Clear to Auscultation, no Crackles, no wheezes Abdomen: Bowel Sound present, Soft and no tenderness, no hernia Skin: no rashes  Extremities: no Pedal edema, no calf tenderness Neurologic: without any new focal findings Gait not checked due to Jose Savage safety concerns  Data Reviewed: I have personally reviewed and interpreted daily labs, tele strips, imagings as discussed above. I reviewed all nursing notes, pharmacy notes, vitals, pertinent old records I have discussed plan of care as described above with RN and Jose Savage/family.  CBC: Recent Labs  Lab 03/04/19 2112 03/06/19 0424 03/06/19 1047 03/07/19 1649  WBC 15.9* 12.2* 13.7* 15.6*  NEUTROABS 11.8* 8.1* 10.4* 11.8*  HGB 17.0 14.7 15.6 15.8  HCT 50.5 44.5 45.5 47.4  MCV 86.2 87.4 84.4 86.8  PLT 239 26* 27* 57*   Basic Metabolic Panel: Recent Labs  Lab 03/04/19 2112 03/06/19 0424 03/07/19 0513  NA 134* 139 133*  K 3.9 3.6 3.7  CL 99 107 102  CO2 26 23 22   GLUCOSE 138* 97 130*  BUN 15 8 9   CREATININE 0.96 0.84 0.88  CALCIUM 9.8 8.4* 8.7*    Liver Function Tests: Recent Labs  Lab 03/04/19 2112 03/06/19 0424 03/07/19 0513  AST 18 93* 63*  ALT 19 24 23   ALKPHOS 100 70 71  BILITOT 0.5 0.7 1.0  PROT 7.6 6.3* 7.0  ALBUMIN 4.0 3.3* 3.5   No results for input(s): LIPASE, AMYLASE in the last 168 hours. No results for input(s): AMMONIA in the last 168 hours. Coagulation  Profile: Recent Labs  Lab 03/04/19 2112  INR 1.0   Cardiac Enzymes: No results for input(s): CKTOTAL, CKMB, CKMBINDEX, TROPONINI in the last 168 hours. BNP (last 3 results) No results for input(s): PROBNP in the last 8760 hours. CBG: Recent Labs  Lab 03/04/19 2248  GLUCAP 123*   Studies: No results found.   Time spent: 35 minutes  Author: Berle Mull, MD [facility] 03/07/2019 6:07 PM  To reach On-call, see care teams to locate the attending and reach out to them via [email or web address]. If 7PM-7AM, please contact night-coverage If you still have difficulty reaching the attending provider, please page the [facility] (Director on Call) for [facility] on amion for assistance.

## 2019-03-08 LAB — CBC WITH DIFFERENTIAL/PLATELET
Abs Immature Granulocytes: 0.08 10*3/uL — ABNORMAL HIGH (ref 0.00–0.07)
Basophils Absolute: 0.1 10*3/uL (ref 0.0–0.1)
Basophils Relative: 0 %
Eosinophils Absolute: 0 10*3/uL (ref 0.0–0.5)
Eosinophils Relative: 0 %
HCT: 47.4 % (ref 39.0–52.0)
Hemoglobin: 15.8 g/dL (ref 13.0–17.0)
Immature Granulocytes: 1 %
Lymphocytes Relative: 15 %
Lymphs Abs: 2.4 10*3/uL (ref 0.7–4.0)
MCH: 28.8 pg (ref 26.0–34.0)
MCHC: 33.3 g/dL (ref 30.0–36.0)
MCV: 86.5 fL (ref 80.0–100.0)
Monocytes Absolute: 1.6 10*3/uL — ABNORMAL HIGH (ref 0.1–1.0)
Monocytes Relative: 10 %
Neutro Abs: 11.8 10*3/uL — ABNORMAL HIGH (ref 1.7–7.7)
Neutrophils Relative %: 74 %
Platelets: 72 10*3/uL — ABNORMAL LOW (ref 150–400)
RBC: 5.48 MIL/uL (ref 4.22–5.81)
RDW: 13.9 % (ref 11.5–15.5)
WBC: 15.9 10*3/uL — ABNORMAL HIGH (ref 4.0–10.5)
nRBC: 0 % (ref 0.0–0.2)

## 2019-03-08 LAB — COMPREHENSIVE METABOLIC PANEL
ALT: 20 U/L (ref 0–44)
AST: 47 U/L — ABNORMAL HIGH (ref 15–41)
Albumin: 3.4 g/dL — ABNORMAL LOW (ref 3.5–5.0)
Alkaline Phosphatase: 67 U/L (ref 38–126)
Anion gap: 11 (ref 5–15)
BUN: 12 mg/dL (ref 8–23)
CO2: 23 mmol/L (ref 22–32)
Calcium: 8.6 mg/dL — ABNORMAL LOW (ref 8.9–10.3)
Chloride: 99 mmol/L (ref 98–111)
Creatinine, Ser: 0.98 mg/dL (ref 0.61–1.24)
GFR calc Af Amer: >60 mL/min (ref >60)
GFR calc non Af Amer: >60 mL/min (ref >60)
Glucose, Bld: 94 mg/dL (ref 70–99)
Potassium: 4.2 mmol/L (ref 3.5–5.1)
Sodium: 133 mmol/L — ABNORMAL LOW (ref 135–145)
Total Bilirubin: 1 mg/dL (ref 0.3–1.2)
Total Protein: 7.1 g/dL (ref 6.5–8.1)

## 2019-03-08 LAB — ECHOCARDIOGRAM COMPLETE
Height: 71 in
Weight: 2910.07 oz

## 2019-03-08 MED ORDER — TICAGRELOR 90 MG PO TABS: 90.0000 mg | ORAL_TABLET | Freq: Two times a day (BID) | ORAL | 0 refills | Status: AC

## 2019-03-08 MED ORDER — COLCHICINE 0.6 MG PO TABS: 0.6000 mg | ORAL_TABLET | Freq: Two times a day (BID) | ORAL | 0 refills | Status: AC

## 2019-03-08 MED ORDER — CYANOCOBALAMIN 1000 MCG PO TABS: 1000.0000 ug | ORAL_TABLET | Freq: Every day | ORAL | 0 refills | Status: AC

## 2019-03-08 NOTE — Progress Notes (Signed)
Pt refused EKG at this time. Provided education to pt. MD aware.

## 2019-03-08 NOTE — Discharge Summary (Signed)
[facility] Discharge Summary   Patient: Jose Savage ZOX:[id number]   PCP: Patient, No Pcp Per DOB: 1955/09/07   Date of admission: 03/04/2019   Date of discharge:  03/08/2019    Discharge Diagnoses:  Principal diagnosis STEMI  Active Problems:   STEMI involving oth coronary artery of inferior wall (HCC)   STEMI (ST elevation myocardial infarction) (HCC)  Admitted From: home Disposition:  Home   Recommendations for Outpatient Follow-up:  1. PCP: please follow up in 1 week 2. Follow up LABS/TEST:  Repeat CBC in 1 week  Follow-up Information    Paraschos, Alexander, MD. Schedule an appointment as soon as possible for a visit in 1 month(s).   Specialty: Cardiology Contact information: [address] [facility] [place] [address] [phone]        Dr Juanda BondLuff. Schedule an appointment as soon as possible for a visit in 1 week(s).   Why: for repeat CBC to check pletelets and referral to PCP for establishing care.          Diet recommendation: Cardiac diet  Activity: The patient is advised to gradually reintroduce usual activities,as tolerated.  Discharge Condition: good  Code Status: Full code   History of present illness: As per the H and P dictated on admission, "Jose CarrowWilliam Trethewey is a 63 y.o. male with medical history significant of CAD s/p stent (many years ago) and CABG x 4 in 2014, hypertension and hyperlipidemia who presented with symptoms of mid-sternal pressure chest pain that radiated to his right shoulder. He at first thought it was indigestion and tried tums without relieve. Later took aspirin x4  and nitro. Pain lasted 4 hours until [place] and called EMS.  EKG showed ST elevations in inferior leads consistent with inferior ST elevation MI and CODE STEMI was activated.  Patient was subsequently taken for emergent cardiac catheterization.  Found to have 'severe two-vessel coronary artery disease with  chronically occluded mid to distal RCA, chronic occluded mid left circumflex.  Atretic LIMA to LAD with patent native LAD.  Occluded SVG to distal RCA with diffuse heavy thrombus burden.' Hospitalist asked by cardiologist Paraschos to admit for continuous medical management.  Patient was placed on nitro and Aggrastat bolus and gtt by cardiology. His chest pain is now a 1/10. BP was noted to be 96/60 with MAP of 71 with continuous IV fluid infusing . Bradycardic down to 50s on room air.   CBC showed leukocytosis of 15.9 but no anemia.  Sodium 134, potassium 3.9, glucose 138, normal creatinine of 0.96.  Troponin of 42.  Negative chest x-ray.  Former tobacco use but quit in 2013. Denies alcohol and illicit drug use."  Hospital Course:  Summary of his active problems in the hospital is as following. Inferior wall STEMI -S/p cardiac cath 12/2 with findings of severe two-vessel CAD, treated LIMA to LAD, occluded SVG to distal RCA with diffuse heavy thrombus burden - Cardiology recommends initial conservative management - Continue dual antiplatelet therapy with aspirin and Brilinta - Patient was treated with Aggrastat but due to severe thrombocytopenia it was discontinued. - unable to provide beta blocker due to heart block - unable to provide ACE I/ARB due to hypotension   Second degree type I heart block Called by RN since the patient started showing heart block on telemetry. EKG performed.  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 Patient remains asymptomatic.  No dizziness or lightheadedness.  No shortness of breath no chest pain. Patient determined to go home. Discussed with Cardiology Dr. Juliann Pares, recommend to discontinue Coreg and recommend that the patient can actually safely be discharged home. Appreciate assistance. Medication reconciliation completed. Patient was recommended to follow-up with  cardiology and PCP.  Essential hypertension. Blood pressure currently soft. - Hold Losartan for now  Hyperlipidemia -Continue Lipitor  Acute thrombocytopenia. Likely secondary to Aggrastat. No active bleeding reported by the patient. Work-up does not show any evidence of acute hemolysis. Repeat CBC shows improvement in the count. D/w hematology and recommended conservative management, repeat CBC in 1 week  Toxic granulation on peripheral smear. No evidence of active bleeding or pneumonia or infection. Continue to monitor for now.  Acute pericarditis. Colchicine started by cardiology.  Relative B12 deficiency  b12 level 194  Pt received sub cu B12 injection.  Continue on discharge.   Patient was ambulatory without any assistance. On the day of the discharge the patient's vitals were stable, and no other acute medical condition were reported by patient. the patient was felt safe to be discharge at Home with no therapy needed on discharge.  Consultants: Cardiology  Procedures: Echocardiogram  IMPRESSIONS    1. Left ventricular ejection fraction, by visual estimation, is 45 to 50%. The left ventricle has low normal function. Left ventricular septal wall thickness was normal. There is no left ventricular hypertrophy.  2. Global right ventricle has normal systolic function.The right ventricular size is normal. No increase in right ventricular wall thickness.  3. Left atrial size was normal.  4. Right atrial size was normal.  5. The mitral valve is normal in structure. Mild mitral valve regurgitation.  6. The tricuspid valve is normal in structure. Tricuspid valve regurgitation is mild.  7. The aortic valve is normal in structure. Aortic valve regurgitation is not visualized.  8. The pulmonic valve was grossly normal. Pulmonic valve regurgitation is not visualized.  9. Inferior hypokinesis.  Cardiac Catheterization   Prox Cx to Mid Cx lesion is 100% stenosed.  Prox  LAD lesion is 40% stenosed.  Mid LAD-1 lesion is 40% stenosed.  Mid LAD-2 lesion is 40% stenosed.  Origin to Prox Graft lesion is 100% stenosed.  Dist RCA lesion is 100% stenosed.  Prox RCA lesion is 60% stenosed.  Prox RCA to Mid RCA lesion is 70% stenosed.  Prox Graft lesion is 100% stenosed.  Previously placed Mid Cx to Dist Cx stent (unknown type) is widely patent.   1.  Severe two-vessel coronary artery disease with chronically occluded mid to distal RCA, chronic occluded mid left circumflex 2.  Atretic LIMA to LAD with patent native LAD 3.  Occluded SVG to distal RCA with diffuse heavy thrombus burden  Recommendations  1.  Initial conservative management 2.  Dual antiplatelet therapy  DISCHARGE MEDICATION: Allergies as of 03/08/2019   No Known Allergies     Medication List    STOP taking these medications   carvedilol 3.125 MG tablet Commonly known as: COREG   losartan 50 MG tablet Commonly known as: COZAAR     TAKE these medications   aspirin 81 MG chewable tablet Chew by mouth daily.   atorvastatin 40 MG tablet Commonly known as: LIPITOR Take 40 mg by mouth daily.   colchicine 0.6 MG tablet Take 1 tablet (0.6 mg total) by mouth 2 (two) times daily.   cyanocobalamin 1000 MCG tablet Take 1 tablet (1,000 mcg total) by mouth daily.   omeprazole 20  MG capsule Commonly known as: PRILOSEC Take 20 mg by mouth daily.   ticagrelor 90 MG Tabs tablet Commonly known as: BRILINTA Take 1 tablet (90 mg total) by mouth 2 (two) times daily.      No Known Allergies Discharge Instructions    Diet - low sodium heart healthy   Complete by: As directed    Discharge instructions   Complete by: As directed    It is important that you read the given instructions as well as go over your medication list with RN to help you understand your care after this hospitalization.  Please follow-up with PCP in 1-2 weeks.  Please note that NO REFILLS for any discharge  medications will be authorized once you are discharged, as it is imperative that you return to your primary care physician (or establish a relationship with a primary care physician if you do not have one) for your aftercare needs so that they can reassess your need for medications and monitor your lab values.  Please request your primary care physician to go over all Hospital Tests and Procedure/Radiological results at the follow up. Please get all Hospital records sent to your PCP by signing hospital release before you go home.   Do not take more than prescribed Pain, Sleep and Anxiety Medications.  You were cared for by a hospitalist during your hospital stay. If you have any questions about your discharge medications or the care you received while you were in the hospital after you are discharged, you can call the unit @ you were admitted to and ask to speak with the hospitalist Lynden Oxford. Ask for Hospitalist on call if the hospitalist that took care of you is not available.   Once you are discharged, your primary care physician will handle any further medical issues.  You Must read complete instructions/literature along with all the possible adverse reactions/side effects for all the Medicines you take and that have been prescribed to you. Take any new Medicines after you have completely understood and accept all the possible adverse reactions/side effects.  If you have smoked or chewed Tobacco in the last 2 yrs please STOP smoking STOP any Recreational drug use.  If you drink alcohol, please safely STOP the use. Do not drive, operating heavy machinery, perform activities at heights, swimming or participation in water activities or provide baby sitting services under influence.  Wear Seat belts while driving.   Increase activity slowly   Complete by: As directed      Discharge Exam: Filed Weights   03/04/19 2113 03/05/19 0000 03/05/19 1350  Weight: 88.5 kg 82.5 kg 82.5 kg    Vitals:   03/08/19 0028 03/08/19 0616  BP: 91/67 96/65  Pulse: 69 63  Resp: 16 19  Temp: 98.4 F (36.9 C)   SpO2: 96% 95%   General: Appear in no distress, no Rash; Oral Mucosa Clear, moist. no Abnormal Mass Or lumps Cardiovascular: S1 and S2 Present, no Murmur, Respiratory: normal respiratory effort, Bilateral Air entry present and Clear to Auscultation, no Crackles, no wheezes Abdomen: Bowel Sound present, Soft and no tenderness, no hernia Extremities: no Pedal edema, no calf tenderness Neurology: alert and oriented to time, place, and person affect appropriate.  The results of significant diagnostics from this hospitalization (including imaging, microbiology, ancillary and laboratory) are listed below for reference.    Significant Diagnostic Studies: Dg Chest Port 1 View  Result Date: 03/04/2019 CLINICAL DATA:  63 year old male with STEMI. EXAM: PORTABLE CHEST 1 VIEW COMPARISON:  None. FINDINGS: There is no focal consolidation, pleural effusion, or pneumothorax. The cardiac silhouette is within normal limits. Median sternotomy wires and CABG vascular clips. No acute osseous pathology. IMPRESSION: No active disease. Electronically Signed   By: Anner Crete M.D.   On: 03/04/2019 23:31    Microbiology: Recent Results (from the past 240 hour(s))  MRSA PCR Screening     Status: None   Collection Time: 03/04/19 10:53 PM   Specimen: Nasal Mucosa; Nasopharyngeal  Result Value Ref Range Status   MRSA by PCR NEGATIVE NEGATIVE Final    Comment:        The GeneXpert MRSA Assay (FDA approved for NASAL specimens only), is one component of a comprehensive MRSA colonization surveillance program. It is not intended to diagnose MRSA infection nor to guide or monitor treatment for MRSA infections. Performed at [facility], [address] [place], [place] [address]   SARS Coronavirus 2 by RT PCR (hospital order, performed in [facility] hospital lab) Nasopharyngeal  Nasopharyngeal Swab     Status: None   Collection Time: 03/04/19 10:54 PM   Specimen: Nasopharyngeal Swab  Result Value Ref Range Status   SARS Coronavirus 2 NEGATIVE NEGATIVE Final    Comment: (NOTE) SARS-CoV-2 target nucleic acids are NOT DETECTED. The SARS-CoV-2 RNA is generally detectable in upper and lower respiratory specimens during the acute phase of infection. The lowest concentration of SARS-CoV-2 viral copies this assay can detect is 250 copies / mL. A negative result does not preclude SARS-CoV-2 infection and should not be used as the sole basis for treatment or other patient management decisions.  A negative result may occur with improper specimen collection / handling, submission of specimen other than nasopharyngeal swab, presence of viral mutation(s) within the areas targeted by this assay, and inadequate number of viral copies (<250 copies / mL). A negative result must be combined with clinical observations, patient history, and epidemiological information. Fact Sheet for Patients:   StrictlyIdeas.no Fact Sheet for Healthcare Providers: BankingDealers.co.za This test is not yet approved or cleared  by the [place] FDA and has been authorized for detection and/or diagnosis of SARS-CoV-2 by FDA under an Emergency Use Authorization (EUA).  This EUA will remain in effect (meaning this test can be used) for the duration of the COVID-19 declaration under Section 564(b)(1) of the Act, 21 U.S.C. section 360bbb-3(b)(1), unless the authorization is terminated or revoked sooner. Performed at [facility], [address] [place], [place] [address]      Labs: CBC: Recent Labs  Lab 03/04/19 2112 03/06/19 0424 03/06/19 1047 03/07/19 1649 03/08/19 0432  WBC 15.9* 12.2* 13.7* 15.6* 15.9*  NEUTROABS 11.8* 8.1* 10.4* 11.8* 11.8*  HGB 17.0 14.7 15.6 15.8 15.8  HCT 50.5 44.5 45.5 47.4 47.4  MCV 86.2 87.4 84.4  86.8 86.5  PLT 239 26* 27* 57* 72*   Basic Metabolic Panel: Recent Labs  Lab 03/04/19 2112 03/06/19 0424 03/07/19 0513 03/08/19 0432  NA 134* 139 133* 133*  K 3.9 3.6 3.7 4.2  CL 99 107 102 99  CO2 26 23 22 23   GLUCOSE 138* 97 130* 94  BUN 15 8 9 12   CREATININE 0.96 0.84 0.88 0.98  CALCIUM 9.8 8.4* 8.7* 8.6*   Liver Function Tests: Recent Labs  Lab 03/04/19 2112 03/06/19 0424 03/07/19 0513 03/08/19 0432  AST 18 93* 63* 47*  ALT 19 24 23 20   ALKPHOS 100 70 71 67  BILITOT 0.5 0.7 1.0 1.0  PROT 7.6 6.3*  7.0 7.1  ALBUMIN 4.0 3.3* 3.5 3.4*   No results for input(s): LIPASE, AMYLASE in the last 168 hours. No results for input(s): AMMONIA in the last 168 hours. Cardiac Enzymes: No results for input(s): CKTOTAL, CKMB, CKMBINDEX, TROPONINI in the last 168 hours. BNP (last 3 results) No results for input(s): BNP in the last 8760 hours. CBG: Recent Labs  Lab 03/04/19 2248  GLUCAP 123*    Time spent: 35 minutes  Signed:  Lynden Oxford  [facility]  03/08/2019 9:31 AM

## 2019-03-08 NOTE — Progress Notes (Signed)
EKG completed and placed in the chart. MD notified and at bedside.

## 2019-03-08 NOTE — Progress Notes (Signed)
[facility] PROGRESS NOTE  Patient: Jose Savage ZGY:[id number]   PCP: Patient, No Pcp Per DOB: 1956/03/01   DOA: 03/04/2019   DOS: 03/08/2019    Assessment and plan: Called by RN since the patient started showing heart block on telemetry. EKG performed.  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 Patient remains asymptomatic.  No dizziness or lightheadedness.  No shortness of breath no chest pain. Patient determined to go home. Discussed with Dr. Clayborn Bigness, recommend to discontinue Coreg and recommend that the patient can actually safely be discharged home. Appreciate assistance. Medication reconciliation completed. Patient was recommended to follow-up with cardiology and PCP.  Author: Berle Mull, MD [facility] 03/08/2019 9:31 AM   If 7PM-7AM, please contact night-coverage at [email or web address]

## 2019-03-08 NOTE — Discharge Instructions (Signed)
Thrombocytopenia Thrombocytopenia means that you have a low number of platelets in your blood. Platelets are tiny cells in the blood. When you bleed, they clump together at the cut or injury to stop the bleeding. This is called blood clotting. If you do not have enough platelets, it can cause bleeding problems. Some cases of this condition are mild while others are more severe.  Follow these instructions at home: Activity  Avoid activities that could cause you to get hurt or bruised. Follow instructions about how to prevent falls.  Take care not to cut yourself: ? When you shave. ? When you use scissors, needles, knives, or other tools.  Take care not to burn yourself: ? When you use an iron. ? When you cook. ?  General instructions   Check your skin and the inside of your mouth for bruises or blood as told by your doctor.  Check to see if there is blood in your spit (sputum), pee, and poop. Do this as told by your doctor.  Do not drink alcohol.  Take over-the-counter and prescription medicines only as told by your doctor.  Do not take any medicines that have NSAIDs in them. These medicines can thin your blood and cause you to bleed.  Tell all of your doctors that you have this condition. Be sure to tell your dentist and eye doctor too. Contact a doctor if:  You have bruises and you do not know why. Get help right away if:  You are bleeding anywhere on your body.  You have blood in your spit, pee, or poop. Summary  Thrombocytopenia means that you have a low number of platelets in your blood.  Platelets are needed for blood clotting.  Symptoms of this condition include bleeding that is not normal, and bruising.  Take care not to cut or burn yourself. This information is not intended to replace advice given to you by your health care provider. Make sure you discuss any questions you have with your health care provider. Document Released: 03/08/2011 Document Revised:  12/19/2017 Document Reviewed: 12/19/2017 Elsevier Patient Education  2020 [employer]   Heart Attack The heart is a muscle that needs oxygen to survive. A heart attack is a condition that occurs when your heart does not get enough oxygen. When this happens, the heart muscle begins to die. This can cause permanent damage if not treated right away. A heart attack is a medical emergency. This condition may be called a myocardial infarction, or MI. It is also known as acute coronary syndrome (ACS). ACS is a term used to describe a group of conditions that affect blood flow to the heart. What are the causes? This condition may be caused by:  Atherosclerosis. This occurs when a fatty substance called plaque builds up in the arteries and blocks or reduces blood supply to the heart.  A blood clot. A blood clot can develop suddenly when plaque breaks up within an artery and blocks blood flow to the heart.  Low blood pressure.  An abnormal heartbeat (arrhythmia).  Conditions that cause a decrease of oxygen to the heart, such as anemiaorrespiratory failure.  A spasm, or severe tightening, of a blood vessel that cuts off blood flow to the heart.  Tearing of a coronary artery (spontaneous coronary artery dissection).  High blood pressure. What increases the risk? The following factors may make you more likely to develop this condition:  Aging. The older you are, the higher your risk.  Having a personal or  family history of chest pain, heart attack, stroke, or narrowing of the arteries in the legs, arms, head, or stomach (peripheral artery disease).  Being male.  Smoking.  Not getting regular exercise.  Being overweight or obese.  Having high blood pressure.  Having high cholesterol (hypercholesterolemia).  Having diabetes.  Drinking too much alcohol.  Using illegal drugs, such as cocaine or methamphetamine. What are the signs or symptoms? Symptoms of this condition may vary,  depending on factors like gender and age. Symptoms may include:  Chest pain. It may feel like: ? Crushing or squeezing. ? Tightness, pressure, fullness, or heaviness.  Pain in the arm, neck, jaw, back, or upper body.  Shortness of breath.  Heartburn or upset stomach.  Nausea.  Sudden cold sweats.  Feeling tired.  Sudden light-headedness. How is this diagnosed? This condition may be diagnosed through tests, such as:  Electrocardiogram (ECG) to measure the electrical activity of your heart.  Blood tests to check for cardiac markers. These chemicals are released by a damaged heart muscle.  A test to evaluate blood flow and heart function (coronary angiogram).  CT scan to see the heart more clearly.  A test to evaluate the pumping action of the heart (echocardiogram). How is this treated? A heart attack must be treated as soon as possible. Treatment may include:  Medicines to: ? Break up or dissolve blood clots (fibrinolytic therapy). ? Thin blood and help prevent blood clots. ? Treat blood pressure. ? Improve blood flow to the heart. ? Reduce pain. ? Reduce cholesterol.  Angioplasty and stent placement. These are procedures to widen a blocked artery and keep it open.  Coronary artery bypass graft, CABG, or open heart surgery. This enables blood to flow to the heart by going around the blocked part of the artery.  Oxygen therapy if needed.  Cardiac rehabilitation. This improves your health and well-being through exercise, education, and counseling. Follow these instructions at home: Medicines  Take over-the-counter and prescription medicines only as told by your health care provider.  Do not take the following medicines unless your health care provider says it is okay to take them: ? NSAIDs, such as ibuprofen. ? Supplements that contain vitamin A, vitamin E, or both. ? Hormone replacement therapy that contains estrogen with or without progestin. Lifestyle   Do  not use any products that contain nicotine or tobacco, such as cigarettes, e-cigarettes, and chewing tobacco. If you need help quitting, ask your health care provider.  Avoid secondhand smoke.  Exercise regularly. Ask your health care provider about participating in a cardiac rehabilitation program that helps you start exercising safely after a heart attack.  Eat a heart-healthy diet. Your health care provider will tell you what foods to eat.  Maintain a healthy weight.  Learn ways to manage stress.  Do not use illegal drugs. Alcohol use  Do not drink alcohol if: ? Your health care provider tells you not to drink. ? You are pregnant, may be pregnant, or are planning to become pregnant.  If you drink alcohol: ? Limit how much you use to:  0-1 drink a day for women.  0-2 drinks a day for men. ? Be aware of how much alcohol is in your drink. In the [place] one drink equals one 12 oz bottle of beer (355 mL), one 5 oz glass of wine (148 mL), or one 1 oz glass of hard liquor (44 mL). General instructions  Work with your health care provider to manage any other conditions  you have, such as high blood pressure or diabetes. These conditions affect your heart.  Get screened for depression, and seek treatment if needed.  Keep your vaccinations up to date. Get the flu vaccine every year.  Keep all follow-up visits as told by your health care provider. This is important. Contact a health care provider if:  You feel overwhelmed or sad.  You have trouble doing your daily activities. Get help right away if:  You have sudden, unexplained discomfort in your chest, arms, back, neck, jaw, or upper body.  You have shortness of breath.  You suddenly start to sweat or your skin gets clammy.  You feel nauseous or you vomit.  You have unexplained tiredness or weakness.  You suddenly feel light-headed or dizzy.  You notice your heart starts to beat fast or feels like it is skipping  beats.  You have blood pressure that is higher than 180/120. These symptoms may represent a serious problem that is an emergency. Do not wait to see if the symptoms will go away. Get medical help right away. Call your local emergency services (911 in the [place] Do not drive yourself to the hospital. Summary  A heart attack, also called myocardial infarction, is a condition that occurs when your heart does not get enough oxygen. This is caused by anything that blocks or reduces blood flow to the heart.  Treatment is a combination of medicines and surgeries, if needed, to open the blocked arteries and restore blood flow to the heart.  A heart attack is an emergency. Get help right away if you have sudden discomfort in your chest, arms, back, neck, jaw, or upper body. Seek help if you feel nauseous, you vomit, or you feel light-headed or dizzy. This information is not intended to replace advice given to you by your health care provider. Make sure you discuss any questions you have with your health care provider. Document Released: 03/19/2005 Document Revised: 06/26/2018 Document Reviewed: 06/30/2018 Elsevier Patient Education  [place].

## 2019-03-08 NOTE — Progress Notes (Signed)
A&O X 4, Pt denies SOB or Pain at this time. Sitting up in bed. Pt Hr is irregular and in the 40's. MD notified in change of heart rhythm. MD will report to bedside.  Will contiune to monitor.

## 2020-10-16 IMAGING — DX DG CHEST 1V PORT
1 series · 2 of 2 positions shown · non-contrast
Comparison: None.

CLINICAL DATA: 63-year-old male with STEMI.

EXAM:
PORTABLE CHEST 1 VIEW

[Series 1: chest ap · 0.14mm/px · 2 of 2 slices shown]
[im 1/2]
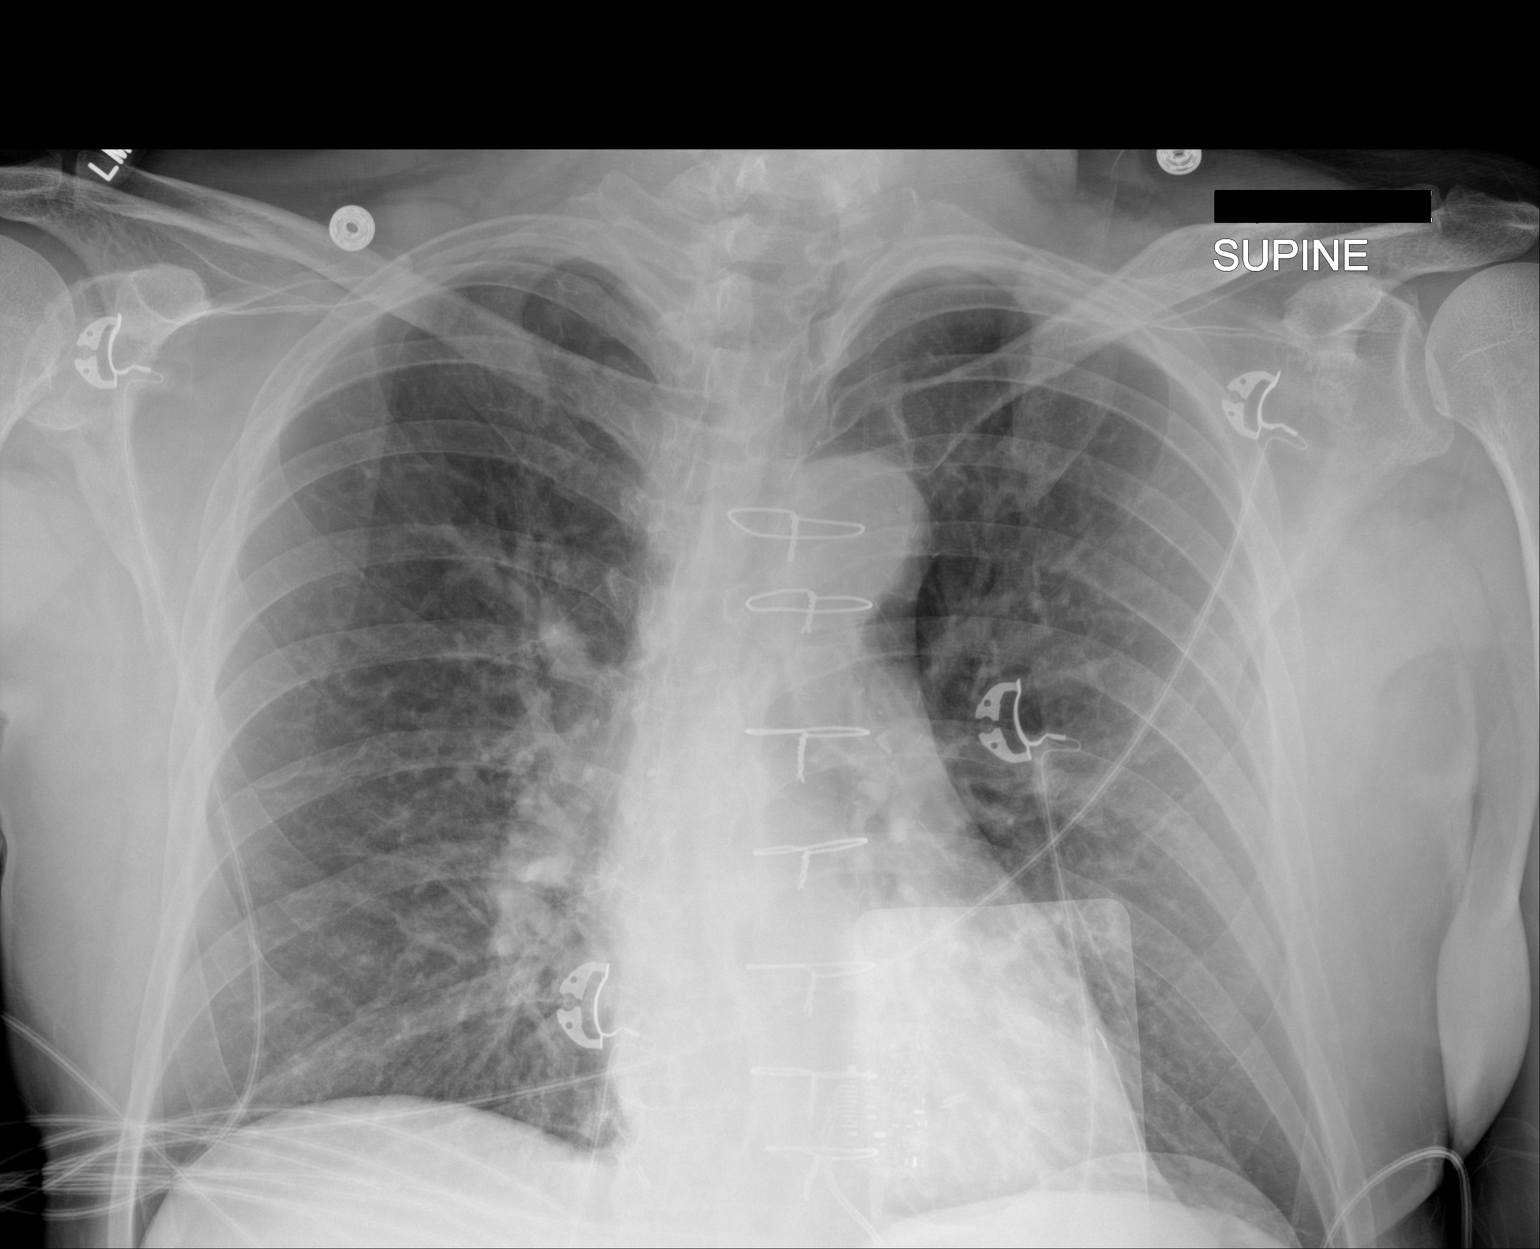
[im 2/2]
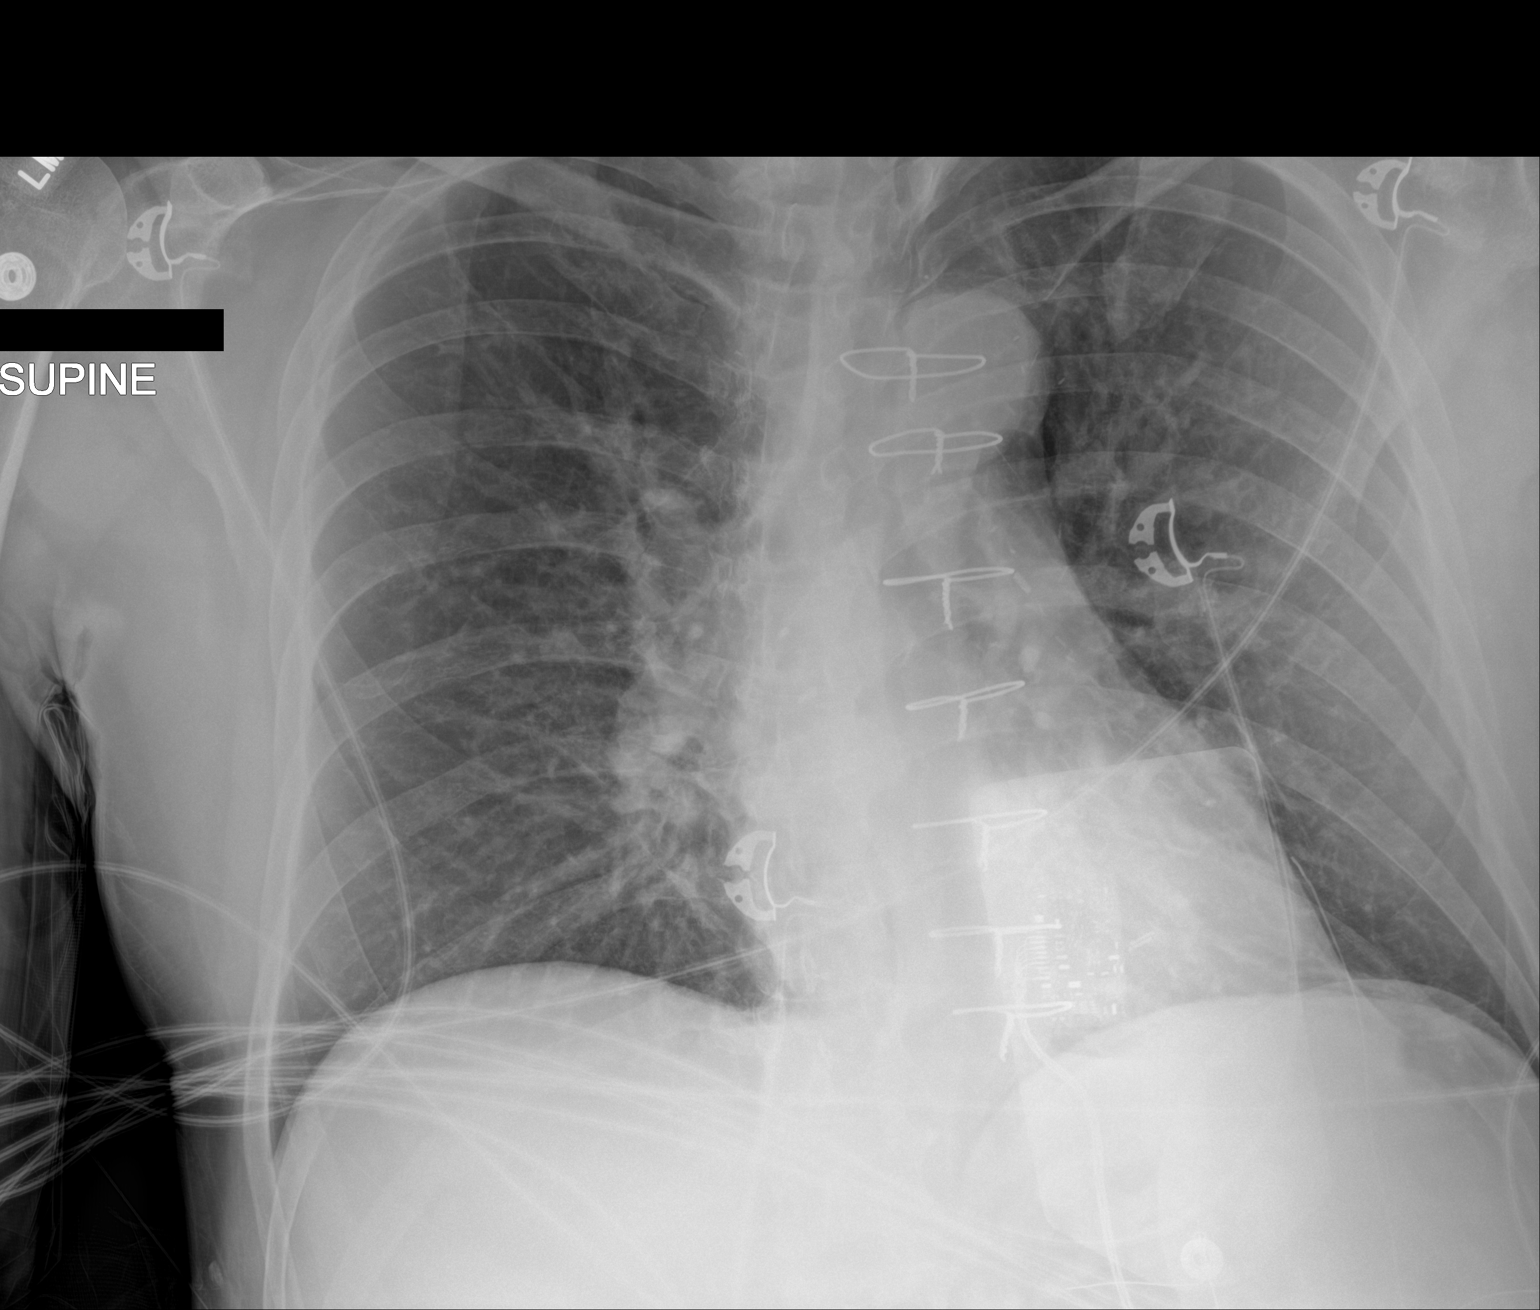

[2 of 2 positions shown; findings below may reference images not displayed]

FINDINGS: There is no focal consolidation, pleural effusion, or pneumothorax.
The cardiac silhouette is within normal limits. Median sternotomy
wires and CABG vascular clips. No acute osseous pathology.
IMPRESSION: No active disease.
